# Patient Record
Sex: Male | Born: 1940 | Race: White | Hispanic: No | Marital: Married | Smoking: Former smoker
Health system: Southern US, Community
[De-identification: ages and names within clinical notes are randomized; demographics above are authoritative.]

## PROBLEM LIST (undated history)

## (undated) DIAGNOSIS — E663 Overweight: Secondary | ICD-10-CM

## (undated) DIAGNOSIS — G4733 Obstructive sleep apnea (adult) (pediatric): Secondary | ICD-10-CM

## (undated) DIAGNOSIS — F419 Anxiety disorder, unspecified: Secondary | ICD-10-CM

## (undated) DIAGNOSIS — J449 Chronic obstructive pulmonary disease, unspecified: Secondary | ICD-10-CM

## (undated) DIAGNOSIS — M1990 Unspecified osteoarthritis, unspecified site: Secondary | ICD-10-CM

## (undated) DIAGNOSIS — I2510 Atherosclerotic heart disease of native coronary artery without angina pectoris: Secondary | ICD-10-CM

## (undated) DIAGNOSIS — N400 Enlarged prostate without lower urinary tract symptoms: Secondary | ICD-10-CM

## (undated) DIAGNOSIS — I10 Essential (primary) hypertension: Secondary | ICD-10-CM

## (undated) DIAGNOSIS — E669 Obesity, unspecified: Secondary | ICD-10-CM

## (undated) DIAGNOSIS — E785 Hyperlipidemia, unspecified: Secondary | ICD-10-CM

## (undated) DIAGNOSIS — N289 Disorder of kidney and ureter, unspecified: Secondary | ICD-10-CM

## (undated) DIAGNOSIS — K219 Gastro-esophageal reflux disease without esophagitis: Secondary | ICD-10-CM

## (undated) DIAGNOSIS — E119 Type 2 diabetes mellitus without complications: Secondary | ICD-10-CM

## (undated) DIAGNOSIS — I4430 Unspecified atrioventricular block: Secondary | ICD-10-CM

## (undated) HISTORY — DX: Overweight: E66.3

## (undated) HISTORY — DX: Unspecified atrioventricular block: I44.30

## (undated) HISTORY — DX: Atherosclerotic heart disease of native coronary artery without angina pectoris: I25.10

## (undated) HISTORY — DX: Obesity, unspecified: E66.9

## (undated) HISTORY — DX: Disorder of kidney and ureter, unspecified: N28.9

## (undated) HISTORY — DX: Obstructive sleep apnea (adult) (pediatric): G47.33

## (undated) HISTORY — DX: Gastro-esophageal reflux disease without esophagitis: K21.9

## (undated) HISTORY — DX: Chronic obstructive pulmonary disease, unspecified: J44.9

## (undated) HISTORY — DX: Type 2 diabetes mellitus without complications: E11.9

## (undated) HISTORY — DX: Anxiety disorder, unspecified: F41.9

## (undated) HISTORY — DX: Benign prostatic hyperplasia without lower urinary tract symptoms: N40.0

## (undated) HISTORY — DX: Unspecified osteoarthritis, unspecified site: M19.90

## (undated) HISTORY — DX: Essential (primary) hypertension: I10

## (undated) HISTORY — DX: Hyperlipidemia, unspecified: E78.5

---
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
## 1993-04-27 DIAGNOSIS — I442 Atrioventricular block, complete: Secondary | ICD-10-CM | POA: Insufficient documentation

## 1993-04-27 DIAGNOSIS — E7800 Pure hypercholesterolemia, unspecified: Secondary | ICD-10-CM | POA: Insufficient documentation

## 2001-05-24 HISTORY — PX: PACEMAKER INSERTION: SHX728

## 2002-09-18 DIAGNOSIS — I10 Essential (primary) hypertension: Secondary | ICD-10-CM | POA: Insufficient documentation

## 2004-05-28 DIAGNOSIS — M1990 Unspecified osteoarthritis, unspecified site: Secondary | ICD-10-CM | POA: Insufficient documentation

## 2008-02-14 ENCOUNTER — Ambulatory Visit: Payer: Self-pay | Admitting: Cardiology

## 2008-08-14 DIAGNOSIS — I2510 Atherosclerotic heart disease of native coronary artery without angina pectoris: Secondary | ICD-10-CM

## 2008-08-14 DIAGNOSIS — Z950 Presence of cardiac pacemaker: Secondary | ICD-10-CM

## 2008-08-14 DIAGNOSIS — E785 Hyperlipidemia, unspecified: Secondary | ICD-10-CM | POA: Insufficient documentation

## 2008-08-15 ENCOUNTER — Encounter: Payer: Self-pay | Admitting: Cardiology

## 2008-08-15 ENCOUNTER — Ambulatory Visit: Payer: Self-pay | Admitting: Cardiology

## 2008-09-03 ENCOUNTER — Telehealth (INDEPENDENT_AMBULATORY_CARE_PROVIDER_SITE_OTHER): Payer: Self-pay

## 2008-09-04 ENCOUNTER — Encounter: Payer: Self-pay | Admitting: Cardiology

## 2008-09-04 ENCOUNTER — Ambulatory Visit: Payer: Self-pay

## 2010-06-05 ENCOUNTER — Encounter: Payer: Self-pay | Admitting: Internal Medicine

## 2010-06-05 DIAGNOSIS — I2510 Atherosclerotic heart disease of native coronary artery without angina pectoris: Secondary | ICD-10-CM | POA: Insufficient documentation

## 2010-06-05 DIAGNOSIS — K219 Gastro-esophageal reflux disease without esophagitis: Secondary | ICD-10-CM | POA: Insufficient documentation

## 2010-07-20 ENCOUNTER — Institutional Professional Consult (permissible substitution) (INDEPENDENT_AMBULATORY_CARE_PROVIDER_SITE_OTHER): Payer: Medicare Other | Admitting: Internal Medicine

## 2010-07-20 ENCOUNTER — Encounter: Payer: Self-pay | Admitting: Internal Medicine

## 2010-07-20 DIAGNOSIS — I442 Atrioventricular block, complete: Secondary | ICD-10-CM

## 2010-07-20 DIAGNOSIS — I2510 Atherosclerotic heart disease of native coronary artery without angina pectoris: Secondary | ICD-10-CM

## 2010-07-20 DIAGNOSIS — E663 Overweight: Secondary | ICD-10-CM

## 2010-07-20 DIAGNOSIS — R55 Syncope and collapse: Secondary | ICD-10-CM | POA: Insufficient documentation

## 2010-07-30 NOTE — Cardiovascular Report (Signed)
Summary: Office Visit   Office Visit   Imported By: Roderic Ovens 07/23/2010 16:11:19  _____________________________________________________________________  External Attachment:    Type:   Image     Comment:   External Document

## 2010-07-30 NOTE — Assessment & Plan Note (Signed)
Summary: nep pacemaker for 9 yrs need replacement...pt has medicare/mu...   Visit Type:  Initial Consult Primary Provider:  Adele Barthel MD  CC:  no complaints.  History of Present Illness:  [name] is seen to establish pacemaker followup. He is a 70 year old gentleman from [place] who has a diagnosis of complete heart block for which he underwent single-chamber pacing at [facility] in May 2003.  This represented a new installation following a pacemaker implanted 10 years previously for syncope and bradycardia. He has had no recurrent syncope.  He also has a history of coronary artery disease. He underwent attempted PCI of a right coronary lesion that was complicated by dissection. He was ultimately treated medically. He denies chest pain shortness of breath or exercise intolerance. He has not had a recent Myoview. His cholesterol is followed by Dr. prior [place]. These results are not available once the laboratory tests that we have.  Current Medications (verified): 1)  Lovastatin 20 Mg Tabs (Lovastatin) .... Take One Tablet By Mouth Daily At Bedtime 2)  Aspirin 81 Mg Tbec (Aspirin) .... Take One Tablet By Mouth Daily 3)  Niacin Cr 1000 Mg Cr-Tabs (Niacin) .... Two Times A Day 4)  Zantac 150 Mg Tabs (Ranitidine Hcl) .... Two Times A Day 5)  Prinivil 10 Mg Tabs (Lisinopril) .... 1/2 Once Daily 6)  Tenormin 100 Mg Tabs (Atenolol) .... Once Daily  Allergies (verified): No Known Drug Allergies  Past History:  Past Medical History: intermittent complete heart block Syncope Previously implanted Medtronic pacemaker 2003 Hypertension Coronary artery disease-[id number] Failed PCI complicated by dissection medical therapy Obesity Hyperlipidemia chronic renal insufficiency creatinine 1.6  Review of Systems       .full review of systems was negative apart from a history of present illness and past medical history.   Vital Signs:  Patient profile:   70 year old male Height:       64 inches Weight:      242 pounds BMI:     41.69 Pulse rate:   76 / minute BP sitting:   118 / 68  (left arm) Cuff size:   regular  Vitals Entered By: Hardin Negus, RMA (July 20, 2010 9:53 AM)  Physical Exam  General:  Alert and oriented  in no acute distress. HEENT  normal; status post eye enucleation. Neck veins were flat; carotids brisk and full without bruits. No lymphadenopathy. Back without kyphosis. Lungs clear. Heart sounds regular without murmurs or gallops. PMI nondisplaced. Abdomen soft with active bowel sounds without midline pulsation or hepatomegaly. Femoral pulses and distal pulses intact. Extremities were without clubbing cyanosis or edemaSkin warm and dry. Neurological exam grossly normal; affect normal; grossly normal motor and sensory function    Impression & Recommendations:  Problem # 1:  SYNCOPE (ICD-780.2)  no recurrent syncope His updated medication list for this problem includes:    Aspirin 81 Mg Tbec (Aspirin) .Marland Kitchen... Take one tablet by mouth daily    Prinivil 10 Mg Tabs (Lisinopril) .Marland Kitchen... 1/2 once daily    Tenormin 100 Mg Tabs (Atenolol) ..... Once daily  His updated medication list for this problem includes:    Aspirin 81 Mg Tbec (Aspirin) .Marland Kitchen... Take one tablet by mouth daily    Prinivil 10 Mg Tabs (Lisinopril) .Marland Kitchen... 1/2 once daily    Tenormin 100 Mg Tabs (Atenolol) ..... Once daily  Problem # 2:  AV BLOCK, COMPLETE -INTERMITTENT (ICD-426.0)  intermittent. He paces about 0.7% of the time at his lower rate limit. His  updated medication list for this problem includes:    Aspirin 81 Mg Tbec (Aspirin) .Marland Kitchen... Take one tablet by mouth daily    Prinivil 10 Mg Tabs (Lisinopril) .Marland Kitchen... 1/2 once daily    Tenormin 100 Mg Tabs (Atenolol) ..... Once daily  His updated medication list for this problem includes:    Aspirin 81 Mg Tbec (Aspirin) .Marland Kitchen... Take one tablet by mouth daily    Prinivil 10 Mg Tabs (Lisinopril) .Marland Kitchen... 1/2 once daily    Tenormin 100 Mg  Tabs (Atenolol) ..... Once daily  Problem # 3:  PACEMAKER, PERMANENT (ICD-V45.01) his device was programmed in the VVIR mode resulting in 7% ventricular pacing the bulk of which was at his higher heart rates. The device was reprogrammed to VVI  Problem # 4:  CAD (ICD-414.00) his statin's and cholesterol are followed by Dr. Reinaldo Raddle. It would be reasonable after 10 years to consider stress testing if he has any symptoms His updated medication list for this problem includes:    Aspirin 81 Mg Tbec (Aspirin) .Marland Kitchen... Take one tablet by mouth daily    Prinivil 10 Mg Tabs (Lisinopril) .Marland Kitchen... 1/2 once daily    Tenormin 100 Mg Tabs (Atenolol) ..... Once daily  Orders: EKG w/ Interpretation (93000)  Problem # 5:  OVERWEIGHT (ICD-278.02) encouraged weight loss  Patient Instructions: 1)  Your physician recommends that you continue on your current medications as directed. Please refer to the Current Medication list given to you today. 2)  Your physician wants you to follow-up in:  YEAR WITH DR Graciela Husbands MEDTRONIC DEVICE  You will receive a reminder letter in the mail two months in advance. If you don't receive a letter, please call our office to schedule the follow-up appointment.

## 2010-08-11 NOTE — Letter (Signed)
Summary: [facility]: Progress Note  [facility]: Progress Note   Imported By: Earl Many 07/30/2010 17:15:44  _____________________________________________________________________  External Attachment:    Type:   Image     Comment:   External Document

## 2010-10-06 NOTE — Assessment & Plan Note (Signed)
[facility]                            CARDIOLOGY OFFICE NOTE   LUCIANO, CINQUEMANI                        MRN:          [id number]  DATE:08/15/2008                            DOB:          1940-07-10    [name] is here for a cardiology followup.  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  He had a tight right  coronary that could not be opened as there was a dissection.  His other  vessels showed no major disease, otherwise.  He has a history of good LV  function.  I had reviewed his data recently when I saw him in September  2009 and he did not have any recent studies.  We do need to arrange for  him to have an adenosine Myoview scan and a 2-D echo at some point.   PAST MEDICAL HISTORY:   ALLERGIES:  No known drug allergies.   MEDICATIONS:  See the EMR.   OTHER MEDICAL PROBLEMS:  See the list below.   REVIEW OF SYSTEMS:  He has no fevers or chills.  There are no headaches.  He is overweight.  He is not having any GI or GU symptoms.  His review  of systems otherwise is negative.   PHYSICAL EXAMINATION:  GENERAL:  The patient is significantly  overweight.  He is oriented to person, time, and place.  Affect is  normal.  HEENT:  No xanthelasma.  He has normal extraocular motion.  NECK:  There are no carotid bruits.  There is no jugular venous  distention.  LUNGS:  Clear.  Respiratory effort is not labored.  CARDIAC:  An S1 with an S2.  There are no clicks or significant murmurs.  ABDOMEN:  Soft.  He has no peripheral edema.   EKG is normal.   The patient has a history of coronary artery disease.  We do need to  obtain an adenosine Myoview scan and this will be obtained.  I will then  see him back for followup in 6 months.     Luis Abed, MD, [facility]  Electronically Signed    JDK/MedQ  DD: 08/15/2008  DT: 08/16/2008  Job #: [id number]   cc:   Adele Barthel, MD- [place], [place], [place].

## 2010-10-06 NOTE — Assessment & Plan Note (Signed)
[facility]                            CARDIOLOGY OFFICE NOTE   NAJIR, ROOP                        MRN:          [id number]  DATE:02/14/2008                            DOB:          1940-12-01    ADDENDUM   I have reviewed all the outside information on Mr. Gronau.  There is no  data listed recently in terms of echoes or nuclear studies.  When I see  him back in 6 months, I will reconsider any uptake in his studies.     Luis Abed, MD, [facility]     JDK/MedQ  DD: 02/14/2008  DT: 02/15/2008  Job #: [id number]

## 2010-10-06 NOTE — Assessment & Plan Note (Signed)
[facility]                            CARDIOLOGY OFFICE NOTE   CRUISE, BAUMGARDNER                        MRN:          [id number]  DATE:02/14/2008                            DOB:          12/20/1940    [name] is referred for a cardiology consultation.  He takes care of  his wife.  Dr. Reinaldo Raddle has referred him for cardiac followup.  Dr. Reinaldo Raddle  follows him carefully and does a very nice job.  [name] does have a  significant cardiac history and would like to have a cardiologist  available to him as needed in our area.   In 1998, the patient had an MI.  I believe he had cardiopulmonary  arrest.  Cardiac cath was done showing that he had a high-grade right  coronary artery lesion.  It is my understanding that the attempt to open  this vessel was not successful and there was dissection.  However, he  stabilized and his other coronaries looks good.  He has done well since  then.  He had severe bradycardia.  He received a pacemaker for this and  had a new unit put in, in 2003.  This is a Medtronic Kappa 700 SR  pacemaker.  It was put in by Dr. Basilia Jumbo at [facility].   [name] is not having any significant symptoms.  He has no chest  pain.  He has no significant shortness of breath.  I have multiple  pieces of data from the outside all of which say that they are printed  in 2009 and it is difficult to find the dates when they were done.  At  this point now, I do not have any recent echo data or exercise data.  Historically, he has had a good ejection fraction.   ALLERGIES:  No known drug allergies.   MEDICATIONS:  Mevacor 20, nabumetone, Tenormin, Prinivil, Zantac,  niacin, lovastatin, and aspirin.   OTHER MEDICAL PROBLEMS:  See the list below.   REVIEW OF SYSTEMS:  He feels great today.   PHYSICAL EXAMINATION:  VITAL SIGNS:  Blood pressure is 122/70 with a  pulse of 64.  GENERAL:  The patient is oriented to person, time, and  place.  Affect is  normal.  HEENT:  Reveals no xanthelasma.  He has normal extraocular motions.  There are no carotid bruits.  There is no jugular venous distention.  LUNGS:  Clear.  Respiratory effort is not labored.  CARDIAC:  Reveals an S1 with an S2.  There are no clicks or significant  murmurs.  ABDOMEN:  Obese, but soft.  He has 2 scars in the area of his pacemaker.  He has no significant peripheral edema.   EKG shows normal sinus rhythm with no acute changes.   [name] is stable.  Problems include,  1. Coronary disease with an inferior myocardial infarction in 1998      with cardiopulmonary arrest.  He had a tight right coronary lesion      that could not be opened as there was dissection at that time.  His  other vessels showed no major disease.  2. History of good left ventricular function.  3. History of a permanent pacemaker for bradycardia.  He has it      checked at home.  We will register his data here.  4. History of left eye removal in the past.  5. Elevated cholesterol.   The patient is stable.  I will not be ordering any test at this time.  We will re-review the information to see if there is any echo data or  exercise data within the past 1-2 years.   ADDENDUM:  I have reviewed all the outside information on [name].  There is no data listed recently in terms of echoes or nuclear studies.  When I see him back in 6 months, I will reconsider any uptake in his  studies.     Luis Abed, MD, [facility]  Electronically Signed    JDK/MedQ  DD: 02/14/2008  DT: 02/15/2008  Job #: [id number]   cc:   Jonelle Sports. Reinaldo Raddle, MD

## 2010-10-22 ENCOUNTER — Ambulatory Visit: Payer: Medicare Other | Admitting: *Deleted

## 2010-10-24 ENCOUNTER — Encounter: Payer: Self-pay | Admitting: *Deleted

## 2010-10-26 ENCOUNTER — Other Ambulatory Visit: Payer: Self-pay

## 2010-10-26 ENCOUNTER — Ambulatory Visit (INDEPENDENT_AMBULATORY_CARE_PROVIDER_SITE_OTHER): Payer: Medicare Other | Admitting: *Deleted

## 2010-10-26 DIAGNOSIS — Z950 Presence of cardiac pacemaker: Secondary | ICD-10-CM

## 2010-10-26 DIAGNOSIS — I495 Sick sinus syndrome: Secondary | ICD-10-CM

## 2010-10-28 NOTE — Progress Notes (Signed)
ICD remote 

## 2010-11-04 NOTE — Progress Notes (Signed)
Pacer remote check  

## 2010-11-06 ENCOUNTER — Encounter: Payer: Self-pay | Admitting: *Deleted

## 2011-02-04 ENCOUNTER — Encounter: Payer: Medicare Other | Admitting: *Deleted

## 2011-02-08 ENCOUNTER — Encounter: Payer: Medicare Other | Admitting: *Deleted

## 2011-02-18 ENCOUNTER — Encounter: Payer: Self-pay | Admitting: Internal Medicine

## 2011-02-18 ENCOUNTER — Other Ambulatory Visit: Payer: Self-pay | Admitting: Internal Medicine

## 2011-02-18 ENCOUNTER — Ambulatory Visit (INDEPENDENT_AMBULATORY_CARE_PROVIDER_SITE_OTHER): Payer: Medicare Other | Admitting: *Deleted

## 2011-02-18 DIAGNOSIS — Z950 Presence of cardiac pacemaker: Secondary | ICD-10-CM

## 2011-02-18 DIAGNOSIS — I498 Other specified cardiac arrhythmias: Secondary | ICD-10-CM

## 2011-02-19 LAB — REMOTE PACEMAKER DEVICE
BATTERY VOLTAGE: 2.57 V
BMOD-0005RV: 95 {beats}/min
RV LEAD THRESHOLD: 0.75 V

## 2011-02-25 ENCOUNTER — Encounter: Payer: Self-pay | Admitting: *Deleted

## 2011-02-25 NOTE — Progress Notes (Signed)
Pacer remote check  

## 2011-05-20 ENCOUNTER — Ambulatory Visit (INDEPENDENT_AMBULATORY_CARE_PROVIDER_SITE_OTHER): Payer: Medicare Other | Admitting: *Deleted

## 2011-05-20 ENCOUNTER — Encounter: Payer: Self-pay | Admitting: Internal Medicine

## 2011-05-20 ENCOUNTER — Other Ambulatory Visit: Payer: Self-pay | Admitting: Internal Medicine

## 2011-05-20 DIAGNOSIS — Z950 Presence of cardiac pacemaker: Secondary | ICD-10-CM

## 2011-05-20 DIAGNOSIS — R55 Syncope and collapse: Secondary | ICD-10-CM

## 2011-05-20 DIAGNOSIS — I495 Sick sinus syndrome: Secondary | ICD-10-CM

## 2011-05-21 LAB — REMOTE PACEMAKER DEVICE: RV LEAD IMPEDENCE PM: 520 Ohm

## 2011-05-27 NOTE — Progress Notes (Signed)
Remote pacer check  

## 2011-06-07 ENCOUNTER — Encounter: Payer: Self-pay | Admitting: *Deleted

## 2011-06-09 ENCOUNTER — Ambulatory Visit (INDEPENDENT_AMBULATORY_CARE_PROVIDER_SITE_OTHER): Payer: Medicare Other | Admitting: Internal Medicine

## 2011-06-09 ENCOUNTER — Encounter: Payer: Self-pay | Admitting: Internal Medicine

## 2011-06-09 ENCOUNTER — Encounter: Payer: Self-pay | Admitting: *Deleted

## 2011-06-09 DIAGNOSIS — Z950 Presence of cardiac pacemaker: Secondary | ICD-10-CM

## 2011-06-09 DIAGNOSIS — I2510 Atherosclerotic heart disease of native coronary artery without angina pectoris: Secondary | ICD-10-CM

## 2011-06-09 DIAGNOSIS — I442 Atrioventricular block, complete: Secondary | ICD-10-CM | POA: Insufficient documentation

## 2011-06-09 LAB — CBC WITH DIFFERENTIAL/PLATELET
Basophils Relative: 0.5 % (ref 0.0–3.0)
Eosinophils Relative: 4 % (ref 0.0–5.0)
HCT: 47.1 % (ref 39.0–52.0)
Lymphs Abs: 2.2 10*3/uL (ref 0.7–4.0)
MCV: 92.5 fl (ref 78.0–100.0)
Monocytes Absolute: 1.1 10*3/uL — ABNORMAL HIGH (ref 0.1–1.0)
Monocytes Relative: 13 % — ABNORMAL HIGH (ref 3.0–12.0)
Neutrophils Relative %: 56.5 % (ref 43.0–77.0)
Platelets: 137 10*3/uL — ABNORMAL LOW (ref 150.0–400.0)
RBC: 5.09 Mil/uL (ref 4.22–5.81)
WBC: 8.6 10*3/uL (ref 4.5–10.5)

## 2011-06-09 LAB — BASIC METABOLIC PANEL
BUN: 25 mg/dL — ABNORMAL HIGH (ref 6–23)
Chloride: 105 mEq/L (ref 96–112)
GFR: 47.39 mL/min — ABNORMAL LOW (ref >60.00)
Potassium: 5.4 mEq/L — ABNORMAL HIGH (ref 3.5–5.1)
Sodium: 141 mEq/L (ref 135–145)

## 2011-06-09 LAB — PROTIME-INR
INR: 1 ratio (ref 0.8–1.0)
Prothrombin Time: 11 s (ref 10.2–12.4)

## 2011-06-09 NOTE — Patient Instructions (Signed)
Your physician recommends that you have lab work today: bmp/cbc/inr  Your physician has recommended that you have a pacemaker generator change. Please see the instruction sheet given to you today for more information.  Your physician recommends that you continue on your current medications as directed. Please refer to the Current Medication list given to you today.

## 2011-06-09 NOTE — Progress Notes (Signed)
  HPI  Kenneth [name] is a 70 y.o. male seen t in followup for a pacemaker implanted initially approximately 20 years ago for intermittent complete heart block. He underwent generator replacement 10 years ago. He comes in now having reached ERI  When he was seen in February he was paced on ecg but intrerrogation showed only 0.7% pacing  His original device was abandoned in its entirety in 2003 with placement of a new ventricular lead at the same time KAPPA 700 pulse generator was implanted.    He also has a history of coronary artery disease. He underwent attempted PCI of a right coronary lesion that was complicated by dissection. He was ultimately treated medically. He denies chest pain shortness of breath or exercise intolerance. He has not had a recent Myoview.    Past Medical History  Diagnosis Date  . Syncope   . AV block   . Overweight   . Hyperlipemia   . Pacemaker   . CAD (coronary artery disease)     Past Surgical History  Procedure Date  . Left eye removal 1957  . Pacemaker insertion 2003    Medtronic    Current Outpatient Prescriptions  Medication Sig Dispense Refill  . aspirin 81 MG tablet Take 81 mg by mouth daily.       . atenolol (TENORMIN) 100 MG tablet Take 100 mg by mouth daily.      . lisinopril (PRINIVIL,ZESTRIL) 10 MG tablet Take 5 mg by mouth daily.       . lovastatin (MEVACOR) 20 MG tablet Take 10 mg by mouth at bedtime.       . niacin (NIASPAN) 1000 MG CR tablet Take 1,000 mg by mouth 2 (two) times daily.       . omeprazole (PRILOSEC) 20 MG capsule Take 20 mg by mouth daily.        No Known Allergies  Review of Systems negative except from HPI and PMH  Physical Exam BP 110/75  Pulse 65  Ht 5' 4" (1.626 m)  Wt 241 lb 12.8 oz (109.68 kg)  BMI 41.50 kg/m2 Well developed and well nourished in no acute distress HENT normal E scleral and icterus clear Neck Supple JVP flat; carotids brisk and full Clear to ausculation Regular rate and rhythm,  no murmurs gallops or rub Soft with active bowel sounds No clubbing cyanosis none Edema Alert and oriented, grossly normal motor and sensory function Skin Warm and Dry  Ventricular pacing at 65  Assessment and  Plan  

## 2011-06-09 NOTE — Assessment & Plan Note (Signed)
Stable on current meds 

## 2011-06-09 NOTE — Assessment & Plan Note (Signed)
Reached ERI. We'll undertake generator replacement

## 2011-06-09 NOTE — Assessment & Plan Note (Addendum)
Degree of ventricular pacing is not known. He is paced now following reversion to VVI 65. He's had no untoward symptoms with this. He has had a single chamber pacemaker for 20 years. We will plan to continue by using the ventricular lead implanted in 2003 in conjunction with the new device. We reviewed potential benefits and risks including but not limited to infection to understand agree and are willing to proceed.  We will plan to do   chest x-ray to see the status of the of the lead.

## 2011-06-10 ENCOUNTER — Encounter (HOSPITAL_COMMUNITY): Payer: Self-pay | Admitting: Pharmacy Technician

## 2011-06-18 ENCOUNTER — Other Ambulatory Visit: Payer: Self-pay | Admitting: Internal Medicine

## 2011-06-18 DIAGNOSIS — Z950 Presence of cardiac pacemaker: Secondary | ICD-10-CM

## 2011-06-22 MED ORDER — SODIUM CHLORIDE 0.9 % IV SOLN
INTRAVENOUS | Status: DC
  Administered 2011-06-23: 11:00:00 via INTRAVENOUS

## 2011-06-22 MED ORDER — CEFAZOLIN SODIUM-DEXTROSE 2-3 GM-% IV SOLR
2.0000 g | INTRAVENOUS | Status: DC
  Filled 2011-06-22: qty 50

## 2011-06-22 MED ORDER — SODIUM CHLORIDE 0.9 % IR SOLN
80.0000 mg | Status: DC
  Filled 2011-06-22: qty 2

## 2011-06-23 ENCOUNTER — Ambulatory Visit (HOSPITAL_COMMUNITY)
Admission: RE | Admit: 2011-06-23 | Discharge: 2011-06-23 | Disposition: A | Discharge status: Home / Self Care | Payer: Medicare Other | Source: Ambulatory Visit | Attending: Internal Medicine | Admitting: Internal Medicine

## 2011-06-23 ENCOUNTER — Encounter (HOSPITAL_COMMUNITY)
Admission: RE | Disposition: A | Discharge status: Home / Self Care | Payer: Self-pay | Source: Ambulatory Visit | Attending: Internal Medicine

## 2011-06-23 ENCOUNTER — Ambulatory Visit (HOSPITAL_COMMUNITY): Payer: Medicare Other

## 2011-06-23 DIAGNOSIS — Z45018 Encounter for adjustment and management of other part of cardiac pacemaker: Secondary | ICD-10-CM | POA: Insufficient documentation

## 2011-06-23 DIAGNOSIS — Z950 Presence of cardiac pacemaker: Secondary | ICD-10-CM

## 2011-06-23 DIAGNOSIS — Z79899 Other long term (current) drug therapy: Secondary | ICD-10-CM | POA: Insufficient documentation

## 2011-06-23 DIAGNOSIS — Z7982 Long term (current) use of aspirin: Secondary | ICD-10-CM | POA: Insufficient documentation

## 2011-06-23 DIAGNOSIS — I2510 Atherosclerotic heart disease of native coronary artery without angina pectoris: Secondary | ICD-10-CM | POA: Insufficient documentation

## 2011-06-23 DIAGNOSIS — R55 Syncope and collapse: Secondary | ICD-10-CM | POA: Insufficient documentation

## 2011-06-23 DIAGNOSIS — I442 Atrioventricular block, complete: Secondary | ICD-10-CM

## 2011-06-23 DIAGNOSIS — E785 Hyperlipidemia, unspecified: Secondary | ICD-10-CM | POA: Insufficient documentation

## 2011-06-23 HISTORY — PX: PERMANENT PACEMAKER GENERATOR CHANGE: SHX6022

## 2011-06-23 LAB — BASIC METABOLIC PANEL
CO2: 23 mEq/L (ref 19–32)
Calcium: 9.5 mg/dL (ref 8.4–10.5)
Chloride: 105 mEq/L (ref 96–112)
Potassium: 4.6 mEq/L (ref 3.5–5.1)
Sodium: 140 mEq/L (ref 135–145)

## 2011-06-23 SURGERY — PERMANENT PACEMAKER GENERATOR CHANGE
Anesthesia: LOCAL

## 2011-06-23 MED ORDER — LIDOCAINE HCL (PF) 1 % IJ SOLN
INTRAMUSCULAR | Status: AC
  Filled 2011-06-23: qty 60

## 2011-06-23 MED ORDER — FENTANYL CITRATE 0.05 MG/ML IJ SOLN
INTRAMUSCULAR | Status: AC
  Filled 2011-06-23: qty 2

## 2011-06-23 MED ORDER — CEFAZOLIN SODIUM-DEXTROSE 2-3 GM-% IV SOLR
INTRAVENOUS | Status: AC
  Filled 2011-06-23: qty 50

## 2011-06-23 MED ORDER — MUPIROCIN 2 % EX OINT
TOPICAL_OINTMENT | CUTANEOUS | Status: AC
  Administered 2011-06-23: 1 via NASAL
  Filled 2011-06-23: qty 22

## 2011-06-23 MED ORDER — CHLORHEXIDINE GLUCONATE 4 % EX LIQD
60.0000 mL | Freq: Once | CUTANEOUS | Status: DC
  Filled 2011-06-23: qty 60

## 2011-06-23 MED ORDER — MIDAZOLAM HCL 5 MG/5ML IJ SOLN
INTRAMUSCULAR | Status: AC
  Filled 2011-06-23: qty 5

## 2011-06-23 MED ORDER — ONDANSETRON HCL 4 MG/2ML IJ SOLN: 4.0000 mg | Freq: Four times a day (QID) | INTRAMUSCULAR | Status: DC | PRN

## 2011-06-23 MED ORDER — MUPIROCIN 2 % EX OINT: TOPICAL_OINTMENT | Freq: Once | CUTANEOUS | Status: DC

## 2011-06-23 MED ORDER — SODIUM CHLORIDE 0.9 % IV SOLN: INTRAVENOUS | Status: AC

## 2011-06-23 MED ORDER — ACETAMINOPHEN 325 MG PO TABS: 325.0000 mg | ORAL_TABLET | ORAL | Status: DC | PRN

## 2011-06-23 MED ORDER — CEFAZOLIN SODIUM 1-5 GM-% IV SOLN: 1.0000 g | Freq: Once | INTRAVENOUS | Status: DC

## 2011-06-23 MED ORDER — SODIUM CHLORIDE 0.9 % IV SOLN: Freq: Once | INTRAVENOUS | Status: DC

## 2011-06-23 NOTE — Interval H&P Note (Signed)
History and Physical Interval Note:  06/23/2011 1:42 PM  Kenneth [name]  has presented today for surgery, with the diagnosis of End of Life  The various methods of treatment have been discussed with the patient and family. After consideration of risks, benefits and other options for treatment, the patient has consented to  Procedure(s): PERMANENT PACEMAKER GENERATOR CHANGE as a surgical intervention .  The patients' history has been reviewed, patient examined, no change in status, stable for surgery.  I have reviewed the patients' chart and labs.  Questions were answered to the patient's satisfaction.     Sherryl Manges

## 2011-06-23 NOTE — Brief Op Note (Signed)
06/23/2011  3:02 PM  PATIENT:  Kenneth [name]  71 y.o. male  PRE-OPERATIVE DIAGNOSIS:  End of Life  POST-OPERATIVE DIAGNOSIS:  Complete heart block intermittent  PROCEDURE:  Procedure(s): PERMANENT PACEMAKER GENERATOR CHANGE Pocket revision  SURGEON:  Surgeon(s): Duke Salvia, MD  PHYSICIAN ASSISTANT:   ASSISTANTS: none   ANESTHESIA:   local and IV sedation   DICTATION: .Other Dictation: Dictation Number [id number]  PLAN OF CARE: Discharge to home after PACU

## 2011-06-23 NOTE — H&P (View-Only) (Signed)
  HPI  Kenneth [name] is a 71 y.o. male seen t in followup for a pacemaker implanted initially approximately 20 years ago for intermittent complete heart block. He underwent generator replacement 10 years ago. He comes in now having reached ERI  When he was seen in February he was paced on ecg but intrerrogation showed only 0.7% pacing  His original device was abandoned in its entirety in 2003 with placement of a new ventricular lead at the same time KAPPA 700 pulse generator was implanted.    He also has a history of coronary artery disease. He underwent attempted PCI of a right coronary lesion that was complicated by dissection. He was ultimately treated medically. He denies chest pain shortness of breath or exercise intolerance. He has not had a recent Myoview.    Past Medical History  Diagnosis Date  . Syncope   . AV block   . Overweight   . Hyperlipemia   . Pacemaker   . CAD (coronary artery disease)     Past Surgical History  Procedure Date  . Left eye removal 1957  . Pacemaker insertion 2003    Medtronic    Current Outpatient Prescriptions  Medication Sig Dispense Refill  . aspirin 81 MG tablet Take 81 mg by mouth daily.       Marland Kitchen atenolol (TENORMIN) 100 MG tablet Take 100 mg by mouth daily.      Marland Kitchen lisinopril (PRINIVIL,ZESTRIL) 10 MG tablet Take 5 mg by mouth daily.       Marland Kitchen lovastatin (MEVACOR) 20 MG tablet Take 10 mg by mouth at bedtime.       . niacin (NIASPAN) 1000 MG CR tablet Take 1,000 mg by mouth 2 (two) times daily.       Marland Kitchen omeprazole (PRILOSEC) 20 MG capsule Take 20 mg by mouth daily.        No Known Allergies  Review of Systems negative except from HPI and PMH  Physical Exam BP 110/75  Pulse 65  Ht 5\' 4"  (1.626 m)  Wt 241 lb 12.8 oz (109.68 kg)  BMI 41.50 kg/m2 Well developed and well nourished in no acute distress HENT normal E scleral and icterus clear Neck Supple JVP flat; carotids brisk and full Clear to ausculation Regular rate and rhythm,  no murmurs gallops or rub Soft with active bowel sounds No clubbing cyanosis none Edema Alert and oriented, grossly normal motor and sensory function Skin Warm and Dry  Ventricular pacing at 65  Assessment and  Plan

## 2011-06-24 NOTE — Op Note (Signed)
NAME:  Kenneth [name], Kenneth [name] NO.:  [id number]  MEDICAL RECORD NO.:  [id number]  LOCATION:  [facility]                         FACILITY:  [facility]  PHYSICIAN:  Duke Salvia, MD, FACCDATE OF BIRTH:  Jan 16, 1941  DATE OF PROCEDURE:  06/23/2011 DATE OF DISCHARGE:  06/23/2011                              OPERATIVE REPORT   PREOPERATIVE DIAGNOSIS:  Previously implanted pacemaker, now at elective replacement indicator.  POSTOPERATIVE DIAGNOSIS:  Previously implanted pacemaker, now at elective replacement indicator.  PROCEDURE:  Single-chamber explantation, pocket revision, and single- chamber implantation.  Following obtaining informed consent, the patient was brought to Electrophysiology Laboratory and placed on the fluoroscopic table in supine position.  After routine prep and drape of the left upper chest, lidocaine was infiltrated just below the line of the previous incision as the device had rotated clockwise about 90-120 degrees.  Following this, an incision was made and carried down to the layer of device pocket using sharp dissection and electrocautery.  The pocket was opened.  The device was explanted.  The lead was inspected and appeared to be in good function.  The previously implanted and sacrificed unipolar lead was in the posterior aspect of the pocket.  Interrogation of the lead demonstrated an amplitude of 9.5 with a pace impedance of 524, threshold 0.7 at 0.5.  With these acceptable parameters recorded, the lead was attached to an Adapta SR1 pulse generator, serial number [id number].  The pocket was copiously irrigated with antibiotic- containing saline solution.  Hemostasis was assured.  Leads and pulse generator were placed in the pocket.  The wound was closed in 3 layers in a normal fashion following antibiotic irrigation.  The patient tolerated the procedure without apparent complication.  Needle counts, sponge counts, and instrument counts were correct at  the end of procedure according to the staff.  Dictation ended at this point.     Duke Salvia, MD, [facility]     SCK/MEDQ  D:  06/23/2011  T:  06/24/2011  Job:  [id number]

## 2011-06-29 ENCOUNTER — Encounter: Payer: Medicare Other | Admitting: Physician Assistant

## 2011-06-30 ENCOUNTER — Encounter: Payer: Self-pay | Admitting: Internal Medicine

## 2011-06-30 ENCOUNTER — Ambulatory Visit (INDEPENDENT_AMBULATORY_CARE_PROVIDER_SITE_OTHER): Payer: Medicare Other | Admitting: *Deleted

## 2011-06-30 DIAGNOSIS — I442 Atrioventricular block, complete: Secondary | ICD-10-CM

## 2011-06-30 LAB — PACEMAKER DEVICE OBSERVATION
BMOD-0003RV: 30
BRDY-0002RV: 50 {beats}/min
RV LEAD AMPLITUDE: 8 mv
RV LEAD THRESHOLD: 0.75 V
VENTRICULAR PACING PM: 0.1

## 2011-06-30 NOTE — Progress Notes (Signed)
Wound check-PPM 

## 2011-07-27 ENCOUNTER — Encounter: Payer: Medicare Other | Admitting: Internal Medicine

## 2011-09-30 ENCOUNTER — Encounter: Payer: Medicare Other | Admitting: *Deleted

## 2011-10-08 ENCOUNTER — Encounter: Payer: Self-pay | Admitting: *Deleted

## 2011-10-19 ENCOUNTER — Telehealth: Payer: Self-pay | Admitting: Internal Medicine

## 2011-10-19 NOTE — Telephone Encounter (Signed)
Have not received transmission.  Wife will try to send again today.  Will look later this afternoon.

## 2011-10-19 NOTE — Telephone Encounter (Signed)
New msg Pt's wife called and wanted to know if received transmission. She said they were out of town when original transmission was scheduled and sent it in the next week.

## 2011-10-20 ENCOUNTER — Ambulatory Visit (INDEPENDENT_AMBULATORY_CARE_PROVIDER_SITE_OTHER): Payer: Medicare Other | Admitting: *Deleted

## 2011-10-20 ENCOUNTER — Encounter: Payer: Self-pay | Admitting: Internal Medicine

## 2011-10-20 DIAGNOSIS — I442 Atrioventricular block, complete: Secondary | ICD-10-CM

## 2011-10-20 LAB — REMOTE PACEMAKER DEVICE
BATTERY VOLTAGE: 2.79 V
BMOD-0003RV: 30
BMOD-0005RV: 95 {beats}/min
RV LEAD IMPEDENCE PM: 564 Ohm
VENTRICULAR PACING PM: 0.4

## 2011-10-20 NOTE — Telephone Encounter (Signed)
Transmission received. Left message with information.

## 2011-10-21 NOTE — Progress Notes (Signed)
PPM remote 

## 2011-12-03 ENCOUNTER — Encounter: Payer: Self-pay | Admitting: *Deleted

## 2012-01-27 ENCOUNTER — Encounter: Payer: Medicare Other | Admitting: *Deleted

## 2012-02-02 ENCOUNTER — Ambulatory Visit (INDEPENDENT_AMBULATORY_CARE_PROVIDER_SITE_OTHER): Payer: Medicare Other | Admitting: *Deleted

## 2012-02-02 ENCOUNTER — Encounter: Payer: Self-pay | Admitting: *Deleted

## 2012-02-02 ENCOUNTER — Encounter: Payer: Self-pay | Admitting: Internal Medicine

## 2012-02-02 DIAGNOSIS — Z950 Presence of cardiac pacemaker: Secondary | ICD-10-CM

## 2012-02-02 DIAGNOSIS — I442 Atrioventricular block, complete: Secondary | ICD-10-CM

## 2012-02-18 LAB — REMOTE PACEMAKER DEVICE
BMOD-0003RV: 30
BRDY-0002RV: 50 {beats}/min
RV LEAD AMPLITUDE: 16 mv
RV LEAD IMPEDENCE PM: 586 Ohm
RV LEAD THRESHOLD: 0.875 V

## 2012-03-01 ENCOUNTER — Encounter: Payer: Self-pay | Admitting: *Deleted

## 2012-05-30 ENCOUNTER — Encounter: Payer: Medicare Other | Admitting: Internal Medicine

## 2012-07-12 DIAGNOSIS — Z0000 Encounter for general adult medical examination without abnormal findings: Secondary | ICD-10-CM | POA: Insufficient documentation

## 2012-08-16 ENCOUNTER — Encounter: Payer: Medicare Other | Admitting: Internal Medicine

## 2012-08-25 ENCOUNTER — Ambulatory Visit (INDEPENDENT_AMBULATORY_CARE_PROVIDER_SITE_OTHER): Payer: Medicare Other | Admitting: Cardiology

## 2012-08-25 ENCOUNTER — Encounter: Payer: Self-pay | Admitting: Cardiology

## 2012-08-25 ENCOUNTER — Encounter: Payer: Self-pay | Admitting: Internal Medicine

## 2012-08-25 VITALS — BP 145/80 | HR 79 | Ht 64.0 in | Wt 251.0 lb

## 2012-08-25 DIAGNOSIS — I2510 Atherosclerotic heart disease of native coronary artery without angina pectoris: Secondary | ICD-10-CM

## 2012-08-25 DIAGNOSIS — Z950 Presence of cardiac pacemaker: Secondary | ICD-10-CM

## 2012-08-25 DIAGNOSIS — I442 Atrioventricular block, complete: Secondary | ICD-10-CM

## 2012-08-25 LAB — PACEMAKER DEVICE OBSERVATION
BATTERY VOLTAGE: 2.79 V
BMOD-0003RV: 30
BMOD-0005RV: 95 {beats}/min
VENTRICULAR PACING PM: 0.5

## 2012-08-25 NOTE — Patient Instructions (Signed)
Remote monitoring is used to monitor your Pacemaker of ICD from home. This monitoring reduces the number of office visits required to check your device to one time per year. It allows [place] to keep an eye on the functioning of your device to ensure it is working properly. You are scheduled for a device check from home on 11/27/12. You may send your transmission at any time that day. If you have a wireless device, the transmission will be sent automatically. After your physician reviews your transmission, you will receive a postcard with your next transmission date.

## 2012-08-25 NOTE — Progress Notes (Signed)
ELECTROPHYSIOLOGY OFFICE NOTE  Patient ID: Kenneth [name] MRN: [id number], DOB/AGE: 1940/10/16   Date of Visit: 08/25/2012  Primary Physician: Kenneth Pitch, MD Primary Cardiologist: Kenneth Husbands, MD Reason for Visit: EP/device follow-up  History of Present Illness  Kenneth [name] is a pleasant 72 year old with transient CHB s/p PPM and CAD who presents today for routine electrophysiology followup. Since last being seen in our clinic, he reports he is doing well. He has no complaints and is excited, preparing to travel cross country this summer with his grandson. Today, he specifically denies chest pain or shortness of breath. He denies palpitations, dizziness, near syncope or syncope. He denies LE swelling, orthopnea, PND or recent weight gain. [name] reports he is compliant and tolerating medications without difficulty.  Past Medical History Past Medical History  Diagnosis Date  . Syncope   . AV block   . Overweight   . Hyperlipemia   . Pacemaker   . CAD (coronary artery disease)     Past Surgical History Past Surgical History  Procedure Laterality Date  . Left eye removal  1957  . Pacemaker insertion  2003    Medtronic     Allergies/Intolerances No Known Allergies  Current Home Medications Current Outpatient Prescriptions  Medication Sig Dispense Refill  . aspirin 81 MG tablet Take 81 mg by mouth daily.       Marland Kitchen atenolol (TENORMIN) 100 MG tablet Take 100 mg by mouth daily.      Marland Kitchen ibuprofen (ADVIL,MOTRIN) 200 MG tablet Take 400 mg by mouth every 6 (six) hours as needed. For pain      . losartan (COZAAR) 50 MG tablet Take 1 tablet by mouth daily.      Marland Kitchen lovastatin (MEVACOR) 20 MG tablet Take 10 mg by mouth at bedtime.       . niacin (NIASPAN) 1000 MG CR tablet Take 1,000 mg by mouth 2 (two) times daily.       Marland Kitchen omeprazole (PRILOSEC) 20 MG capsule Take 20 mg by mouth daily.       No current facility-administered medications for this visit.    Social History Social History   . Marital Status: Married   Occupational History  . retired    Social History Main Topics  . Smoking status: Former Smoker    Types: Cigarettes    Quit date: 05/25/1991  . Smokeless tobacco: No  . Alcohol Use: No  . Drug Use: No   Review of Systems General: No chills, fever, night sweats or weight changes Cardiovascular: No chest pain, dyspnea on exertion, edema, orthopnea, palpitations, paroxysmal nocturnal dyspnea Dermatological: No rash, lesions or masses Respiratory: No cough, dyspnea Urologic: No hematuria, dysuria Abdominal: No nausea, vomiting, diarrhea, bright red blood per rectum, melena, or hematemesis Neurologic: No visual changes, weakness, changes in mental status All other systems reviewed and are otherwise negative except as noted above.  Physical Exam Blood pressure 145/80, pulse 79, height 5\' 4"  (1.626 m), weight 251 lb (113.853 kg), SpO2 96.00%.  General: Well developed, well appearing 72 year old male in no acute distress. HEENT: Normocephalic, atraumatic. EOMs intact. Sclera nonicteric. Oropharynx clear.  Neck: Supple. No JVD. Lungs: Respirations regular and unlabored, CTA bilaterally. No wheezes, rales or rhonchi. Heart: RRR. S1, S2 present. No murmurs, rub, S3 or S4. Abdomen: Soft, non-distended.  Extremities: No clubbing, cyanosis or edema. DP/PT/Radials 2+ and equal bilaterally. Psych: Normal affect. Neuro: Alert and oriented X 3. Moves all extremities spontaneously.   Diagnostics Device interrogation today -  Normal VVI PPM function. Threshold, sensing, impedance consistent with previous measurements. Device programmed to maximize longevity. 1 VHR episode, 3 seconds, EGM reviewed and NOT consistent with NSVT. No evidence of ventricular arrhythmia. Device programmed at appropriate safety margins. Histogram distribution appropriate for patient activity level. Device programmed to optimize intrinsic conduction. Estimated longevity 10 years.   Assessment  and Plan 1. Complete heart block, transient, s/p PPM implant Normal PPM function No programming changes made Continue routine remote device follow-up every 3 months Return to clinic for follow-up with Dr. Graciela [name] in one year 2. CAD Stable without anginal symptoms Continue medical therapy  Signed, Kenneth Duff, PA-C 08/25/2012, 12:23 PM

## 2012-11-27 ENCOUNTER — Encounter: Payer: Medicare Other | Admitting: *Deleted

## 2012-12-08 ENCOUNTER — Encounter: Payer: Self-pay | Admitting: *Deleted

## 2012-12-14 ENCOUNTER — Ambulatory Visit (INDEPENDENT_AMBULATORY_CARE_PROVIDER_SITE_OTHER): Payer: Medicare Other | Admitting: *Deleted

## 2012-12-14 DIAGNOSIS — I442 Atrioventricular block, complete: Secondary | ICD-10-CM

## 2012-12-14 DIAGNOSIS — Z950 Presence of cardiac pacemaker: Secondary | ICD-10-CM

## 2012-12-28 LAB — REMOTE PACEMAKER DEVICE
BMOD-0003RV: 30
BRDY-0002RV: 50 {beats}/min
RV LEAD AMPLITUDE: 16 mv
RV LEAD IMPEDENCE PM: 667 Ohm
RV LEAD THRESHOLD: 0.75 V

## 2013-01-08 ENCOUNTER — Encounter: Payer: Self-pay | Admitting: Internal Medicine

## 2013-03-26 ENCOUNTER — Ambulatory Visit (INDEPENDENT_AMBULATORY_CARE_PROVIDER_SITE_OTHER): Payer: Medicare Other | Admitting: *Deleted

## 2013-03-26 DIAGNOSIS — Z950 Presence of cardiac pacemaker: Secondary | ICD-10-CM

## 2013-03-26 DIAGNOSIS — I442 Atrioventricular block, complete: Secondary | ICD-10-CM

## 2013-03-27 LAB — REMOTE PACEMAKER DEVICE
BRDY-0002RV: 50 {beats}/min
VENTRICULAR PACING PM: 1

## 2013-04-06 ENCOUNTER — Telehealth: Payer: Self-pay | Admitting: *Deleted

## 2013-04-06 NOTE — Telephone Encounter (Signed)
Ms. Tyrease requesting printout of most recent home transmission---printed & mailed today.

## 2013-05-02 ENCOUNTER — Encounter: Payer: Self-pay | Admitting: *Deleted

## 2013-05-07 ENCOUNTER — Encounter: Payer: Self-pay | Admitting: Internal Medicine

## 2013-06-27 ENCOUNTER — Ambulatory Visit (INDEPENDENT_AMBULATORY_CARE_PROVIDER_SITE_OTHER): Payer: Medicare Other | Admitting: *Deleted

## 2013-06-27 DIAGNOSIS — I442 Atrioventricular block, complete: Secondary | ICD-10-CM

## 2013-07-03 LAB — MDC_IDC_ENUM_SESS_TYPE_REMOTE
Lead Channel Impedance Value: 620 Ohm
Lead Channel Setting Pacing Amplitude: 2.5 V
Lead Channel Setting Sensing Sensitivity: 2.8 mV
MDC IDC MSMT LEADCHNL RV PACING THRESHOLD AMPLITUDE: 0.5 V
MDC IDC MSMT LEADCHNL RV PACING THRESHOLD PULSEWIDTH: 0.4 ms
MDC IDC MSMT LEADCHNL RV SENSING INTR AMPL: 16 mV
MDC IDC SET LEADCHNL RV PACING PULSEWIDTH: 0.4 ms
MDC IDC STAT BRADY RV PERCENT PACED: 0.6 %

## 2013-07-09 DIAGNOSIS — N400 Enlarged prostate without lower urinary tract symptoms: Secondary | ICD-10-CM | POA: Insufficient documentation

## 2013-07-18 ENCOUNTER — Encounter: Payer: Self-pay | Admitting: *Deleted

## 2013-08-07 ENCOUNTER — Encounter: Payer: Self-pay | Admitting: Internal Medicine

## 2013-08-28 ENCOUNTER — Encounter: Payer: Self-pay | Admitting: Internal Medicine

## 2013-08-28 ENCOUNTER — Ambulatory Visit (INDEPENDENT_AMBULATORY_CARE_PROVIDER_SITE_OTHER): Payer: Medicare Other | Admitting: Internal Medicine

## 2013-08-28 VITALS — BP 130/80 | HR 71 | Ht 64.0 in | Wt 266.0 lb

## 2013-08-28 DIAGNOSIS — I442 Atrioventricular block, complete: Secondary | ICD-10-CM

## 2013-08-28 DIAGNOSIS — Z950 Presence of cardiac pacemaker: Secondary | ICD-10-CM

## 2013-08-28 NOTE — Patient Instructions (Signed)
Remote monitoring is used to monitor your pacemaker from home. This monitoring reduces the number of office visits required to check your device to one time per year. It allows us to keep an eye on the functioning of your device to ensure it is working properly. You are scheduled for a device check from home on 11-29-2013. You may send your transmission at any time that day. If you have a wireless device, the transmission will be sent automatically. After your physician reviews your transmission, you will receive a postcard with your next transmission date.  Your physician recommends that you schedule a follow-up appointment in: 12 months with Dr.Klein

## 2013-08-28 NOTE — Progress Notes (Signed)
      Patient Care Team: Jerene Pitchobert E Pryor as PCP - General (Internal Medicine)   HPI  Kenneth [name] is a 73 y.o. male Seen in followup for CHB s/p PPM   This was initially implanted about 25 years ago underwent generator replacement most recently 2013.  Is of coronary artery disease.  No chest pain Chronic DOE  Past Medical History  Diagnosis Date  . Syncope   . AV block   . Overweight   . Hyperlipemia   . Pacemaker   . CAD (coronary artery disease)     Past Surgical History  Procedure Laterality Date  . Left eye removal  1957  . Pacemaker insertion  2003    Medtronic    Current Outpatient Prescriptions  Medication Sig Dispense Refill  . aspirin 81 MG tablet Take 81 mg by mouth daily.       Marland Kitchen. atenolol (TENORMIN) 100 MG tablet Take 100 mg by mouth daily.      Marland Kitchen. ibuprofen (ADVIL,MOTRIN) 200 MG tablet Take 400 mg by mouth every 6 (six) hours as needed. For pain      . losartan (COZAAR) 50 MG tablet Take 1 tablet by mouth daily.      Marland Kitchen. lovastatin (MEVACOR) 20 MG tablet Take 10 mg by mouth at bedtime.       . niacin (NIASPAN) 1000 MG CR tablet Take 1,000 mg by mouth 2 (two) times daily.       Marland Kitchen. omeprazole (PRILOSEC) 20 MG capsule Take 20 mg by mouth daily.      . tamsulosin (FLOMAX) 0.4 MG CAPS capsule Take 0.4 mg by mouth daily.       No current facility-administered medications for this visit.    No Known Allergies  Review of Systems negative except from HPI and PMH  Physical Exam BP 130/80  Pulse 71  Ht 5\' 4"  (1.626 m)  Wt 266 lb (120.657 kg)  BMI 45.64 kg/m2 Well developed and well nourished in no acute distress HENT normal E scleral and icterus clear Neck Supple JVP flat; carotids brisk and full Clear to ausculation Device pocket well healed; without hematoma or erythema.  There is no tethering   egular rate and rhythm, no murmurs gallops or rub Soft with active bowel sounds No clubbing cyanosis  Edema Alert and oriented, grossly normal motor and  sensory function Skin Warm and Dry  ECG  Sinus at 71 17/09/39  Assessment and  Plan  Intermittent complete heart block 0.6% vpacing at 50 bpm  Pacemaker-Medtronic The patient's device was interrogated.  The information was reviewed. No changes were made in the programming.      Dyslipidemia   I asked him to followup with his PCP regarding alternative therapy for his hypertriglyceridemia as opposed to niacin

## 2013-08-29 LAB — MDC_IDC_ENUM_SESS_TYPE_INCLINIC
Battery Impedance: 305 Ohm
Battery Voltage: 2.78 V
Date Time Interrogation Session: 20150407174316
Lead Channel Impedance Value: 561 Ohm
Lead Channel Pacing Threshold Pulse Width: 0.4 ms
Lead Channel Setting Sensing Sensitivity: 2.8 mV
MDC IDC MSMT BATTERY REMAINING LONGEVITY: 112 mo
MDC IDC MSMT LEADCHNL RA IMPEDANCE VALUE: 0 Ohm
MDC IDC MSMT LEADCHNL RV PACING THRESHOLD AMPLITUDE: 0.75 V
MDC IDC MSMT LEADCHNL RV SENSING INTR AMPL: 11.2 mV
MDC IDC SET LEADCHNL RV PACING AMPLITUDE: 2.5 V
MDC IDC SET LEADCHNL RV PACING PULSEWIDTH: 0.4 ms
MDC IDC STAT BRADY RV PERCENT PACED: 1 %

## 2013-09-12 ENCOUNTER — Encounter: Payer: Self-pay | Admitting: Internal Medicine

## 2013-10-17 DIAGNOSIS — G4733 Obstructive sleep apnea (adult) (pediatric): Secondary | ICD-10-CM | POA: Insufficient documentation

## 2013-11-29 ENCOUNTER — Ambulatory Visit (INDEPENDENT_AMBULATORY_CARE_PROVIDER_SITE_OTHER): Payer: Medicare Other | Admitting: *Deleted

## 2013-11-29 DIAGNOSIS — Z950 Presence of cardiac pacemaker: Secondary | ICD-10-CM

## 2013-11-29 DIAGNOSIS — I442 Atrioventricular block, complete: Secondary | ICD-10-CM

## 2013-11-29 NOTE — Progress Notes (Signed)
Remote pacemaker transmission.   

## 2013-12-01 LAB — MDC_IDC_ENUM_SESS_TYPE_REMOTE
Battery Impedance: 305 Ohm
Battery Voltage: 2.78 V
Date Time Interrogation Session: 20150709132238
Lead Channel Impedance Value: 0 Ohm
Lead Channel Pacing Threshold Pulse Width: 0.4 ms
Lead Channel Setting Sensing Sensitivity: 2.8 mV
MDC IDC MSMT BATTERY REMAINING LONGEVITY: 111 mo
MDC IDC MSMT LEADCHNL RV IMPEDANCE VALUE: 608 Ohm
MDC IDC MSMT LEADCHNL RV PACING THRESHOLD AMPLITUDE: 0.75 V
MDC IDC MSMT LEADCHNL RV SENSING INTR AMPL: 8 mV
MDC IDC SET LEADCHNL RV PACING AMPLITUDE: 2.5 V
MDC IDC SET LEADCHNL RV PACING PULSEWIDTH: 0.4 ms
MDC IDC STAT BRADY RV PERCENT PACED: 1 %

## 2013-12-31 ENCOUNTER — Encounter: Payer: Self-pay | Admitting: Internal Medicine

## 2014-01-25 DIAGNOSIS — IMO0002 Reserved for concepts with insufficient information to code with codable children: Secondary | ICD-10-CM | POA: Insufficient documentation

## 2014-03-04 ENCOUNTER — Ambulatory Visit (INDEPENDENT_AMBULATORY_CARE_PROVIDER_SITE_OTHER): Payer: Medicare Other | Admitting: *Deleted

## 2014-03-04 ENCOUNTER — Encounter: Payer: Self-pay | Admitting: Internal Medicine

## 2014-03-04 DIAGNOSIS — I442 Atrioventricular block, complete: Secondary | ICD-10-CM

## 2014-03-04 LAB — MDC_IDC_ENUM_SESS_TYPE_REMOTE
Battery Impedance: 330 Ohm
Battery Voltage: 2.78 V
Brady Statistic RV Percent Paced: 1 %
Lead Channel Impedance Value: 0 Ohm
Lead Channel Pacing Threshold Amplitude: 0.75 V
Lead Channel Setting Pacing Amplitude: 2.5 V
Lead Channel Setting Pacing Pulse Width: 0.4 ms
Lead Channel Setting Sensing Sensitivity: 2.8 mV
MDC IDC MSMT BATTERY REMAINING LONGEVITY: 109 mo
MDC IDC MSMT LEADCHNL RV IMPEDANCE VALUE: 608 Ohm
MDC IDC MSMT LEADCHNL RV PACING THRESHOLD PULSEWIDTH: 0.4 ms
MDC IDC MSMT LEADCHNL RV SENSING INTR AMPL: 16 mV
MDC IDC SESS DTM: 20151012143810

## 2014-03-04 NOTE — Progress Notes (Signed)
Remote pacemaker transmission.   

## 2014-03-27 ENCOUNTER — Encounter: Payer: Self-pay | Admitting: Cardiology

## 2014-04-01 DIAGNOSIS — N183 Chronic kidney disease, stage 3 unspecified: Secondary | ICD-10-CM | POA: Insufficient documentation

## 2014-04-01 DIAGNOSIS — E119 Type 2 diabetes mellitus without complications: Secondary | ICD-10-CM | POA: Insufficient documentation

## 2014-04-01 DIAGNOSIS — J449 Chronic obstructive pulmonary disease, unspecified: Secondary | ICD-10-CM | POA: Insufficient documentation

## 2014-05-02 ENCOUNTER — Encounter (HOSPITAL_COMMUNITY): Payer: Self-pay | Admitting: Internal Medicine

## 2014-06-06 ENCOUNTER — Ambulatory Visit (INDEPENDENT_AMBULATORY_CARE_PROVIDER_SITE_OTHER): Payer: Medicare Other | Admitting: *Deleted

## 2014-06-06 DIAGNOSIS — I442 Atrioventricular block, complete: Secondary | ICD-10-CM

## 2014-06-06 NOTE — Progress Notes (Signed)
Remote pacemaker transmission.   

## 2014-06-10 LAB — MDC_IDC_ENUM_SESS_TYPE_REMOTE
Battery Remaining Longevity: 105 mo
Battery Voltage: 2.78 V
Lead Channel Impedance Value: 632 Ohm
Lead Channel Pacing Threshold Amplitude: 0.75 V
Lead Channel Pacing Threshold Pulse Width: 0.4 ms
Lead Channel Sensing Intrinsic Amplitude: 11.2 mV
Lead Channel Setting Pacing Pulse Width: 0.4 ms
MDC IDC MSMT BATTERY IMPEDANCE: 379 Ohm
MDC IDC MSMT LEADCHNL RA IMPEDANCE VALUE: 0 Ohm
MDC IDC SESS DTM: 20160114191342
MDC IDC SET LEADCHNL RV PACING AMPLITUDE: 2.5 V
MDC IDC SET LEADCHNL RV SENSING SENSITIVITY: 4 mV
MDC IDC STAT BRADY RV PERCENT PACED: 0 %

## 2014-07-26 DIAGNOSIS — F419 Anxiety disorder, unspecified: Secondary | ICD-10-CM | POA: Insufficient documentation

## 2014-08-08 ENCOUNTER — Encounter: Payer: Self-pay | Admitting: Cardiology

## 2014-08-20 ENCOUNTER — Encounter: Payer: Self-pay | Admitting: Internal Medicine

## 2014-08-23 DIAGNOSIS — I2699 Other pulmonary embolism without acute cor pulmonale: Secondary | ICD-10-CM | POA: Insufficient documentation

## 2014-08-29 ENCOUNTER — Encounter: Payer: Medicare Other | Admitting: Internal Medicine

## 2014-09-02 DIAGNOSIS — Z5181 Encounter for therapeutic drug level monitoring: Secondary | ICD-10-CM | POA: Insufficient documentation

## 2014-09-23 ENCOUNTER — Observation Stay (HOSPITAL_COMMUNITY): Payer: Medicare Other

## 2014-09-23 ENCOUNTER — Inpatient Hospital Stay (HOSPITAL_COMMUNITY): Payer: Medicare Other

## 2014-09-23 ENCOUNTER — Inpatient Hospital Stay (HOSPITAL_COMMUNITY)
Admission: EM | Admit: 2014-09-23 | Discharge: 2014-09-24 | DRG: 292 | Disposition: A | Discharge status: Home / Self Care | Payer: Medicare Other | Source: Other Acute Inpatient Hospital | Attending: Cardiovascular Disease | Admitting: Cardiovascular Disease

## 2014-09-23 ENCOUNTER — Other Ambulatory Visit (HOSPITAL_COMMUNITY): Payer: Medicare Other

## 2014-09-23 DIAGNOSIS — I2510 Atherosclerotic heart disease of native coronary artery without angina pectoris: Secondary | ICD-10-CM | POA: Diagnosis present

## 2014-09-23 DIAGNOSIS — I442 Atrioventricular block, complete: Secondary | ICD-10-CM | POA: Diagnosis present

## 2014-09-23 DIAGNOSIS — D72829 Elevated white blood cell count, unspecified: Secondary | ICD-10-CM | POA: Diagnosis present

## 2014-09-23 DIAGNOSIS — R0609 Other forms of dyspnea: Secondary | ICD-10-CM | POA: Diagnosis present

## 2014-09-23 DIAGNOSIS — T380X5A Adverse effect of glucocorticoids and synthetic analogues, initial encounter: Secondary | ICD-10-CM | POA: Diagnosis present

## 2014-09-23 DIAGNOSIS — Z8679 Personal history of other diseases of the circulatory system: Secondary | ICD-10-CM

## 2014-09-23 DIAGNOSIS — J441 Chronic obstructive pulmonary disease with (acute) exacerbation: Secondary | ICD-10-CM | POA: Diagnosis present

## 2014-09-23 DIAGNOSIS — E785 Hyperlipidemia, unspecified: Secondary | ICD-10-CM | POA: Diagnosis present

## 2014-09-23 DIAGNOSIS — I5033 Acute on chronic diastolic (congestive) heart failure: Principal | ICD-10-CM | POA: Diagnosis present

## 2014-09-23 DIAGNOSIS — I5031 Acute diastolic (congestive) heart failure: Secondary | ICD-10-CM

## 2014-09-23 DIAGNOSIS — Z86711 Personal history of pulmonary embolism: Secondary | ICD-10-CM | POA: Diagnosis not present

## 2014-09-23 DIAGNOSIS — Z87891 Personal history of nicotine dependence: Secondary | ICD-10-CM | POA: Diagnosis not present

## 2014-09-23 DIAGNOSIS — R410 Disorientation, unspecified: Secondary | ICD-10-CM

## 2014-09-23 DIAGNOSIS — R0600 Dyspnea, unspecified: Secondary | ICD-10-CM | POA: Diagnosis not present

## 2014-09-23 DIAGNOSIS — E119 Type 2 diabetes mellitus without complications: Secondary | ICD-10-CM | POA: Diagnosis present

## 2014-09-23 DIAGNOSIS — N183 Chronic kidney disease, stage 3 (moderate): Secondary | ICD-10-CM | POA: Diagnosis not present

## 2014-09-23 DIAGNOSIS — Z950 Presence of cardiac pacemaker: Secondary | ICD-10-CM

## 2014-09-23 DIAGNOSIS — Z8249 Family history of ischemic heart disease and other diseases of the circulatory system: Secondary | ICD-10-CM | POA: Diagnosis not present

## 2014-09-23 DIAGNOSIS — R7989 Other specified abnormal findings of blood chemistry: Secondary | ICD-10-CM

## 2014-09-23 LAB — BASIC METABOLIC PANEL
Anion gap: 11 (ref 5–15)
BUN: 23 mg/dL — ABNORMAL HIGH (ref 6–20)
CHLORIDE: 104 mmol/L (ref 101–111)
CO2: 21 mmol/L — ABNORMAL LOW (ref 22–32)
Calcium: 8.7 mg/dL — ABNORMAL LOW (ref 8.9–10.3)
Creatinine, Ser: 1.62 mg/dL — ABNORMAL HIGH (ref 0.61–1.24)
GFR calc Af Amer: 47 mL/min — ABNORMAL LOW (ref >60)
GFR, EST NON AFRICAN AMERICAN: 40 mL/min — AB (ref >60)
Glucose, Bld: 247 mg/dL — ABNORMAL HIGH (ref 70–99)
POTASSIUM: 4.5 mmol/L (ref 3.5–5.1)
SODIUM: 136 mmol/L (ref 135–145)

## 2014-09-23 LAB — MAGNESIUM: MAGNESIUM: 1.2 mg/dL — AB (ref 1.7–2.4)

## 2014-09-23 LAB — CBC WITH DIFFERENTIAL/PLATELET
Basophils Absolute: 0 10*3/uL (ref 0.0–0.1)
Basophils Relative: 0 % (ref 0–1)
EOS PCT: 0 % (ref 0–5)
Eosinophils Absolute: 0 10*3/uL (ref 0.0–0.7)
HCT: 48.3 % (ref 39.0–52.0)
Hemoglobin: 16.5 g/dL (ref 13.0–17.0)
LYMPHS PCT: 7 % — AB (ref 12–46)
Lymphs Abs: 0.9 10*3/uL (ref 0.7–4.0)
MCH: 28.8 pg (ref 26.0–34.0)
MCHC: 34.2 g/dL (ref 30.0–36.0)
MCV: 84.3 fL (ref 78.0–100.0)
MONO ABS: 0.1 10*3/uL (ref 0.1–1.0)
MONOS PCT: 1 % — AB (ref 3–12)
Neutro Abs: 12.2 10*3/uL — ABNORMAL HIGH (ref 1.7–7.7)
Neutrophils Relative %: 92 % — ABNORMAL HIGH (ref 43–77)
Platelets: 105 10*3/uL — ABNORMAL LOW (ref 150–400)
RBC: 5.73 MIL/uL (ref 4.22–5.81)
RDW: 14.2 % (ref 11.5–15.5)
WBC: 13.2 10*3/uL — AB (ref 4.0–10.5)

## 2014-09-23 LAB — TROPONIN I
TROPONIN I: 0.08 ng/mL — AB (ref <0.031)
Troponin I: 0.11 ng/mL — ABNORMAL HIGH (ref <0.031)
Troponin I: 0.08 ng/mL — ABNORMAL HIGH (ref <0.031)

## 2014-09-23 LAB — TSH: TSH: 1.831 u[IU]/mL (ref 0.350–4.500)

## 2014-09-23 LAB — PROTIME-INR
INR: 1.44 (ref 0.00–1.49)
PROTHROMBIN TIME: 17.7 s — AB (ref 11.6–15.2)

## 2014-09-23 LAB — BRAIN NATRIURETIC PEPTIDE: B Natriuretic Peptide: 332.6 pg/mL — ABNORMAL HIGH (ref 0.0–100.0)

## 2014-09-23 LAB — HEPARIN LEVEL (UNFRACTIONATED): Heparin Unfractionated: 0.23 IU/mL — ABNORMAL LOW (ref 0.30–0.70)

## 2014-09-23 LAB — APTT: APTT: 33 s (ref 24–37)

## 2014-09-23 MED ORDER — ATENOLOL 100 MG PO TABS
100.0000 mg | ORAL_TABLET | Freq: Every day | ORAL | Status: DC
  Administered 2014-09-23 – 2014-09-24 (×2): 100 mg via ORAL
  Filled 2014-09-23 (×2): qty 1

## 2014-09-23 MED ORDER — PANTOPRAZOLE SODIUM 40 MG PO TBEC
40.0000 mg | DELAYED_RELEASE_TABLET | Freq: Every day | ORAL | Status: DC
  Administered 2014-09-23 – 2014-09-24 (×2): 40 mg via ORAL
  Filled 2014-09-23 (×2): qty 1

## 2014-09-23 MED ORDER — TAMSULOSIN HCL 0.4 MG PO CAPS
0.4000 mg | ORAL_CAPSULE | Freq: Every day | ORAL | Status: DC
  Administered 2014-09-23 – 2014-09-24 (×2): 0.4 mg via ORAL
  Filled 2014-09-23 (×2): qty 1

## 2014-09-23 MED ORDER — ONDANSETRON HCL 4 MG/2ML IJ SOLN: 4.0000 mg | Freq: Four times a day (QID) | INTRAMUSCULAR | Status: DC | PRN

## 2014-09-23 MED ORDER — NITROGLYCERIN 0.4 MG SL SUBL: 0.4000 mg | SUBLINGUAL_TABLET | SUBLINGUAL | Status: DC | PRN

## 2014-09-23 MED ORDER — NIACIN ER (ANTIHYPERLIPIDEMIC) 500 MG PO TBCR
1000.0000 mg | EXTENDED_RELEASE_TABLET | Freq: Two times a day (BID) | ORAL | Status: DC
  Administered 2014-09-23 – 2014-09-24 (×3): 1000 mg via ORAL
  Filled 2014-09-23 (×4): qty 2

## 2014-09-23 MED ORDER — CETYLPYRIDINIUM CHLORIDE 0.05 % MT LIQD
7.0000 mL | Freq: Two times a day (BID) | OROMUCOSAL | Status: DC
Start: 2014-09-23 — End: 2014-09-24
  Administered 2014-09-23 – 2014-09-24 (×3): 7 mL via OROMUCOSAL

## 2014-09-23 MED ORDER — SODIUM CHLORIDE 0.9 % IJ SOLN: 3.0000 mL | INTRAMUSCULAR | Status: DC | PRN

## 2014-09-23 MED ORDER — SODIUM CHLORIDE 0.9 % IV SOLN: 250.0000 mL | INTRAVENOUS | Status: DC | PRN

## 2014-09-23 MED ORDER — ACETAMINOPHEN 325 MG PO TABS: 650.0000 mg | ORAL_TABLET | ORAL | Status: DC | PRN

## 2014-09-23 MED ORDER — LOSARTAN POTASSIUM 50 MG PO TABS
50.0000 mg | ORAL_TABLET | Freq: Every day | ORAL | Status: DC
Start: 2014-09-23 — End: 2014-09-24
  Administered 2014-09-24: 50 mg via ORAL
  Filled 2014-09-23 (×2): qty 1

## 2014-09-23 MED ORDER — PRAVASTATIN SODIUM 10 MG PO TABS
10.0000 mg | ORAL_TABLET | Freq: Every day | ORAL | Status: DC
  Administered 2014-09-23: 10 mg via ORAL
  Filled 2014-09-23 (×2): qty 1

## 2014-09-23 MED ORDER — ASPIRIN EC 81 MG PO TBEC: 81.0000 mg | DELAYED_RELEASE_TABLET | Freq: Every day | ORAL | Status: DC

## 2014-09-23 MED ORDER — ASPIRIN EC 81 MG PO TBEC
81.0000 mg | DELAYED_RELEASE_TABLET | Freq: Every day | ORAL | Status: DC
  Administered 2014-09-23 – 2014-09-24 (×2): 81 mg via ORAL
  Filled 2014-09-23 (×2): qty 1

## 2014-09-23 MED ORDER — SODIUM CHLORIDE 0.9 % IJ SOLN
3.0000 mL | Freq: Two times a day (BID) | INTRAMUSCULAR | Status: DC
  Administered 2014-09-23 (×2): 3 mL via INTRAVENOUS

## 2014-09-23 MED ORDER — WARFARIN SODIUM 5 MG PO TABS
5.0000 mg | ORAL_TABLET | Freq: Once | ORAL | Status: AC
  Administered 2014-09-23: 5 mg via ORAL
  Filled 2014-09-23: qty 1

## 2014-09-23 MED ORDER — WARFARIN - PHARMACIST DOSING INPATIENT: Freq: Every day | Status: DC

## 2014-09-23 MED ORDER — PREDNISONE 20 MG PO TABS
40.0000 mg | ORAL_TABLET | Freq: Every day | ORAL | Status: DC
  Administered 2014-09-23 – 2014-09-24 (×2): 40 mg via ORAL
  Filled 2014-09-23 (×3): qty 2

## 2014-09-23 MED ORDER — HEPARIN (PORCINE) IN NACL 100-0.45 UNIT/ML-% IJ SOLN
1450.0000 [IU]/h | INTRAMUSCULAR | Status: DC
  Administered 2014-09-23: 1300 [IU]/h via INTRAVENOUS
  Administered 2014-09-24: 1450 [IU]/h via INTRAVENOUS
  Filled 2014-09-23 (×4): qty 250

## 2014-09-23 NOTE — Progress Notes (Signed)
  Echocardiogram 2D Echocardiogram has been performed.  Delcie RochENNINGTON, Chanise Habeck 09/23/2014, 4:55 PM

## 2014-09-23 NOTE — Care Management Note (Signed)
Case Management Note  Patient Details  Name: Kenneth [name] MRN: [id number] Date of Birth: [birth year]  Subjective/Objective:     Mr. [name] is a 74 yo man with PMH of COPD, CAD, diastolic HF on home lasix, PPM for complete heart block 25 years ago who presented with leg swelling and dyspnea. Limited data on transfer but he appears to have COPD vs. Diastolic heart failure. Neck veins mildly up and he received prednisone and IV lasix.               Action/Plan: 1. observation 2. trend cardiac biomarkers, on telemetry 3. chest x-ray, BNP, bmp 4. lasix 40 mg Iv x1 5. pharmacy to dose heparin given INR subtherapeutic and recent PE, continue warfarin also 6. Prednisone 40 mg x3 days 7. Echocardiogram 8. Interrogate PPM  Expected Discharge Date:  09/27/14               Expected Discharge Plan:  Home w Home Health Services  In-House Referral:     Discharge planning Services  CM Consult  Post Acute Care Choice:    Choice offered to:     DME Arranged:    DME Agency:     HH Arranged:    HH Agency:     Status of Service:  In process, will continue to follow  Medicare Important Message Given:    Date Medicare IM Given:    Medicare IM give by:    Date Additional Medicare IM Given:    Additional Medicare Important Message give by:     If discussed at Long Length of Stay Meetings, dates discussed:    Additional Comments:  Oletta CohnWood, Zhanna Melin, RN 09/23/2014, 2:45 PM

## 2014-09-23 NOTE — Progress Notes (Signed)
ANTICOAGULATION CONSULT NOTE - Follow Up Consult  Pharmacy Consult for heparin Indication: PE  Allergies  Allergen Reactions  . Lovastatin Other (See Comments)    myalgias  . Lisinopril     itching    Patient Measurements: Height: 5\' 3"  (160 cm) Weight: 254 lb 9.6 oz (115.486 kg) IBW/kg (Calculated) : 56.9 Heparin Dosing Weight: 85 kg  Vital Signs: Temp: 97.5 F (36.4 C) (05/02 1454) Temp Source: Oral (05/02 1454) BP: 149/81 mmHg (05/02 1454) Pulse Rate: 72 (05/02 1454)  Labs:  Recent Labs  09/23/14 0914  HGB 16.5  HCT 48.3  PLT 105*  APTT 33  LABPROT 17.7*  INR 1.44  CREATININE 1.62*  TROPONINI 0.11*    Estimated Creatinine Clearance: 46.1 mL/min (by C-G formula based on Cr of 1.62).   Medications:  Scheduled:  . antiseptic oral rinse  7 mL Mouth Rinse BID  . aspirin EC  81 mg Oral Daily  . atenolol  100 mg Oral Daily  . losartan  50 mg Oral Daily  . niacin  1,000 mg Oral BID  . pantoprazole  40 mg Oral Daily  . pravastatin  10 mg Oral q1800  . predniSONE  40 mg Oral QAC breakfast  . sodium chloride  3 mL Intravenous Q12H  . tamsulosin  0.4 mg Oral Daily  . warfarin  5 mg Oral ONCE-1800  . Warfarin - Pharmacist Dosing Inpatient   Does not apply q1800   Infusions:  . heparin 1,300 Units/hr (09/23/14 0944)    Assessment: 74 yo male with PE is currently on subtherapeutic heparin.  Heparin level is 0.23.  Goal of Therapy:  Heparin level 0.3-0.7 units/ml Monitor platelets by anticoagulation protocol: Yes   Plan:  - Increase heparin to 1450 units/hr. 8hr heparin level  Kaylen Nghiem, Tsz-Yin 09/23/2014,3:21 PM

## 2014-09-23 NOTE — Progress Notes (Signed)
ANTICOAGULATION CONSULT NOTE - Initial Consult  Pharmacy Consult for Heparin and Coumadin  Indication: pulmonary embolus  No Known Allergies  Patient Measurements: Height: 5\' 3"  (160 cm) Weight: 254 lb 9.6 oz (115.486 kg) IBW/kg (Calculated) : 56.9 Heparin Dosing Weight: 85 kg  Vital Signs: Temp: 97.3 F (36.3 C) (05/02 0616) Temp Source: Oral (05/02 0616) BP: 153/93 mmHg (05/02 0616) Pulse Rate: 74 (05/02 0616)  Labs [facility] WBC 14.1 Hgb 16.1 Hct 48.6 Plt 113  SCr 1.9  INR 1.6  No results for input(s): HGB, HCT, PLT, APTT, LABPROT, INR, HEPARINUNFRC, CREATININE, CKTOTAL, CKMB, TROPONINI in the last 72 hours.  CrCl cannot be calculated (Patient has no serum creatinine result on file.).   Medical History: Past Medical History  Diagnosis Date  . Syncope   . AV block   . Overweight(278.02)   . Hyperlipemia   . Pacemaker   . CAD (coronary artery disease)     Medications:  Zantac Lasix  Atenolol  Buspar  Duoneb  Losartan   Coumadin 2.5 mg daily  Assessment: 74 yo male admitted with SOB/DOE,  H/o recent PE and subtherapeutic INR , for anticoagulation.    Goal of Therapy:  INR 2-3 Heparin level 0.3-0.7 units/ml Monitor platelets by anticoagulation protocol: Yes   Plan:  Start heparin 1300 units/hr Check heparin level in 8 hours.  Coumadin 5 mg today  Eddie Candlebbott, Latonyia Lopata Vernon 09/23/2014,6:21 AM

## 2014-09-23 NOTE — Discharge Instructions (Signed)

## 2014-09-23 NOTE — Progress Notes (Signed)
Pt arrived to floor via EMS transport. Report received from [facility] at [facility]. Pt A/Ox4, VSS. Pt in NAD, on 2Lnc. No complaints of pain, no requests at this time. NSR in 70s on telemetry, pt with pacemaker. Pt oriented to room and floor, call bell within reach. Leeann MustJacob Kelly with cardiology paged for admitting orders. Will continue to monitor. Huel Coventryosenberger, Chonda Baney A, RN

## 2014-09-23 NOTE — Progress Notes (Addendum)
Pt came to door and told NT that he had woken up on the floor. Pt stated he did not know how he got to floor. Pt with no complaints of pain, able to move all extremities, no bruising or skin tears noted. Pt alert to self and date, does not remember where he is or how he got here. Pt IV out and bleeding from IV site, pt bather, new gown placed and put in bed. Bed alarm on, falls risk bracelet applied and yellow socks applied, call bell within reach.   IV team paged for new IV site and pharmacy notified that Heparin gtt stopped at this time d/t no IV access.   Spoke with patient's wife and son, wife states pt occasionally gets confused and did not remember how he got to the hospital in [place].     09/23/14 1915  What Happened  Was fall witnessed? No  Was patient injured? Unsure  Patient found other (Comment) (walking to door by NT)  Found by Staff-comment (Cari, NT)  Stated prior activity other (comment) (pt does not remember)     09/23/14 1915  Follow Up  MD notified Barrett, MD  Time MD notified [id number]  Additional tests Yes-comment (head CT)  Simple treatment Other (comment) (gauze to IV site)    09/23/14 1941  Follow Up  Family notified Yes-comment  Time family notified 1942

## 2014-09-23 NOTE — Progress Notes (Addendum)
Patient admitted early this morning, plans noted. Trending enzyme. Will arrange medtronic device interrogation.  Ramond DialSigned, Hao Meng PA Pager: [phone]  Patient examined chart reviewed.  COPD exacerbation better.  Neck and trapezius muscle pain better No clear how much CHF is playing role AICD deep in left pre pectoral area.  Will interrogate  He is chronically On oxygen continue and follow sats  Possible d/c am if continues to be better  [employer] CorporationPeter Remer Couse

## 2014-09-23 NOTE — Progress Notes (Signed)
Utilization Review completed in error.  Will undo UR.

## 2014-09-23 NOTE — H&P (Addendum)
Kenneth [name] is an 74 y.o. male.    Chief Complaint: dyspnea on exertion, leg swelling Primary Cardiologist: Graciela HusbandsKlein HPI: Kenneth [name] is a 74 yo man with PMH of COPD, CAD, complete heart block with PPM with Medtronic device for 25 years, PE diagnosed as outpatient 1-2 months ago treated with warfarin who presented to the ER with dyspnea and leg swelling and found to have mildly elevated troponin at 0.146 and concern for heart failure vs. COPD. Given elevated troponin they called [facility] where patient preferred to be transferred. In the ER he received methylprednisolone 125 mg x1 and IV lasix and subsequently felt much improved with his breathing. On arrival, he was asymptomatic in good spirits. He denied recent illness, travel or infectious symptoms. No chest pain. He's been having leg swelling for a few weeks. He takes lasix at home daily. No syncope.   Past Medical History  Diagnosis Date  . Syncope   . AV block   . Overweight(278.02)   . Hyperlipemia   . Pacemaker   . CAD (coronary artery disease)     Past Surgical History  Procedure Laterality Date  . Left eye removal  1957  . Pacemaker insertion  2003    Medtronic  . Permanent pacemaker generator change N/A 06/23/2011    Procedure: PERMANENT PACEMAKER GENERATOR CHANGE;  Surgeon: Duke SalviaSteven C Klein, MD;  [facility];  Service: Cardiovascular;  Laterality: N/A;    Family History  Problem Relation Age of Onset  . Heart disease Mother    Social History:  reports that he quit smoking about 23 years ago. His smoking use included Cigarettes. He does not have any smokeless tobacco history on file. He reports that he does not drink alcohol or use illicit drugs.  Allergies: No Known Allergies  Medications Prior to Admission  Medication Sig Dispense Refill  . aspirin 81 MG tablet Take 81 mg by mouth daily.     Marland Kitchen. atenolol (TENORMIN) 100 MG tablet Take 100 mg by mouth daily.    Marland Kitchen. ibuprofen (ADVIL,MOTRIN) 200 MG tablet Take 400 mg by  mouth every 6 (six) hours as needed. For pain    . losartan (COZAAR) 50 MG tablet Take 1 tablet by mouth daily.    Marland Kitchen. lovastatin (MEVACOR) 20 MG tablet Take 10 mg by mouth at bedtime.     . niacin (NIASPAN) 1000 MG CR tablet Take 1,000 mg by mouth 2 (two) times daily.     Marland Kitchen. omeprazole (PRILOSEC) 20 MG capsule Take 20 mg by mouth daily.    . tamsulosin (FLOMAX) 0.4 MG CAPS capsule Take 0.4 mg by mouth daily.      No results found for this or any previous visit (from the past 48 hour(s)). No results found.  Review of Systems  Constitutional: Negative for fever, chills and weight loss.  HENT: Positive for hearing loss. Negative for ear pain and tinnitus.   Eyes: Negative for double vision and photophobia.  Respiratory: Positive for shortness of breath. Negative for hemoptysis and sputum production.   Cardiovascular: Positive for leg swelling. Negative for chest pain and palpitations.  Gastrointestinal: Negative for heartburn, nausea and vomiting.  Musculoskeletal: Negative for myalgias and neck pain.  Skin: Negative for rash.  Neurological: Negative for dizziness, tingling and tremors.  Endo/Heme/Allergies: Negative for polydipsia.  Psychiatric/Behavioral: Negative for suicidal ideas and substance abuse.    Blood pressure 139/98, pulse 80, temperature 98.2 F (36.8 C), temperature source Oral, resp. rate 22, height 5' (1.524 m), weight  115.486 kg (254 lb 9.6 oz), SpO2 93 %. Physical Exam  Nursing note and vitals reviewed. Constitutional: He is oriented to person, place, and time. He appears well-developed and well-nourished. No distress.  HENT:  Head: Normocephalic and atraumatic.  Nose: Nose normal.  Mouth/Throat: No oropharyngeal exudate.  Eyes: Conjunctivae and EOM are normal. Pupils are equal, round, and reactive to light. No scleral icterus.  Neck: Normal range of motion. Neck supple. JVD present. No tracheal deviation present.  3-4 cm above clavicle slight HJR  Cardiovascular:  Normal rate, regular rhythm, normal heart sounds and intact distal pulses.  Exam reveals no gallop.   No murmur heard. Respiratory: Effort normal and breath sounds normal. No respiratory distress. He has no wheezes.  GI: Soft. Bowel sounds are normal. He exhibits no distension. There is no tenderness.  Musculoskeletal: Normal range of motion. He exhibits edema.  Neurological: He is alert and oriented to person, place, and time. No cranial nerve deficit. Coordination normal.  Skin: Skin is warm and dry. No rash noted. He is not diaphoretic. No erythema.  Psychiatric: He has a normal mood and affect. His behavior is normal. Thought content normal.   ECG reviewed; sinus rhythm, Poor r-wave progression, sinus tachycardia Labs reviewed; wbc 14.1, h/h 16/48.1, plt 113, na 140, K 4.6, Cl 104, cow 24, bun/cr 28/1.9, calcium 8.9, negative influenza A/B INR 1.6 Trop 0.148 (ULN 0.056) abg 7.46, 30, 61, 92% I was told he had a BNP of > 800 but not seen on chart.   Assessment/Plan Kenneth [name] is a 74 yo man with PMH of COPD, CAD, diastolic HF on home lasix, PPM for complete heart block 25 years ago who presented with leg swelling and dyspnea. Limited data on transfer but he appears to have COPD vs. Diastolic heart failure. Neck veins mildly up and he received prednisone and IV lasix. Will trend/repeat troponins here and continue home medications.  Problem List Dyspnea on exertion - acute diastolic heart failure Recent PE COPD - ? Exacerbation Acute diastolic heart failure  Chronic Kidney Disease Stage III 1. observation 2. trend cardiac biomarkers, on telemetry 3. chest x-ray, BNP, bmp 4. lasix 40 mg Iv x1 5. pharmacy to dose heparin given INR subtherapeutic and recent PE, continue warfarin also 6. Prednisone 40 mg x3 days 7. Echocardiogram 8. Interrogate PPM Lelaina Oatis 09/23/2014, 5:27 AM

## 2014-09-24 ENCOUNTER — Telehealth: Payer: Self-pay | Admitting: Internal Medicine

## 2014-09-24 DIAGNOSIS — Z8679 Personal history of other diseases of the circulatory system: Secondary | ICD-10-CM

## 2014-09-24 DIAGNOSIS — I2510 Atherosclerotic heart disease of native coronary artery without angina pectoris: Secondary | ICD-10-CM

## 2014-09-24 LAB — CBC
HCT: 49.1 % (ref 39.0–52.0)
Hemoglobin: 16.2 g/dL (ref 13.0–17.0)
MCH: 28.4 pg (ref 26.0–34.0)
MCHC: 33 g/dL (ref 30.0–36.0)
MCV: 86 fL (ref 78.0–100.0)
Platelets: 88 10*3/uL — ABNORMAL LOW (ref 150–400)
RBC: 5.71 MIL/uL (ref 4.22–5.81)
RDW: 14.5 % (ref 11.5–15.5)
WBC: 22.1 10*3/uL — ABNORMAL HIGH (ref 4.0–10.5)

## 2014-09-24 LAB — HEMOGLOBIN A1C
Hgb A1c MFr Bld: 7 % — ABNORMAL HIGH (ref 4.8–5.6)
Mean Plasma Glucose: 154 mg/dL

## 2014-09-24 LAB — PROTIME-INR
INR: 1.39 (ref 0.00–1.49)
PROTHROMBIN TIME: 17.2 s — AB (ref 11.6–15.2)

## 2014-09-24 LAB — GLUCOSE, CAPILLARY: Glucose-Capillary: 206 mg/dL — ABNORMAL HIGH (ref 70–99)

## 2014-09-24 LAB — BASIC METABOLIC PANEL
Anion gap: 11 (ref 5–15)
BUN: 27 mg/dL — ABNORMAL HIGH (ref 6–20)
CALCIUM: 8.8 mg/dL — AB (ref 8.9–10.3)
CHLORIDE: 107 mmol/L (ref 101–111)
CO2: 21 mmol/L — AB (ref 22–32)
Creatinine, Ser: 1.41 mg/dL — ABNORMAL HIGH (ref 0.61–1.24)
GFR calc non Af Amer: 48 mL/min — ABNORMAL LOW (ref >60)
GFR, EST AFRICAN AMERICAN: 56 mL/min — AB (ref >60)
Glucose, Bld: 169 mg/dL — ABNORMAL HIGH (ref 70–99)
Potassium: 4.7 mmol/L (ref 3.5–5.1)
Sodium: 139 mmol/L (ref 135–145)

## 2014-09-24 MED ORDER — PREDNISONE 20 MG PO TABS: ORAL_TABLET | ORAL | Status: DC

## 2014-09-24 MED ORDER — PREDNISONE 20 MG PO TABS: 40.0000 mg | ORAL_TABLET | Freq: Every day | ORAL | Status: DC
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
## 2014-09-24 NOTE — Progress Notes (Signed)
RN assessed pt and IV out on floor. Pt bathed and heparin restarted and IV site wrapped with kerlex. Pharmacy notified that heparin gtt had been stopped for at least 30 minutes and restarted.

## 2014-09-24 NOTE — Discharge Summary (Signed)
Discharge Summary   Patient ID: Kenneth [name],  MRN: [id number], DOB/AGE: [birth year] 73 y.o.  Admit date: 09/23/2014 Discharge date: 09/24/2014  Primary Care Provider: PRYOR,ROBERT E Primary Cardiologist: Dr. Graciela HusbandsKlein  Discharge Diagnoses Principal Problem:   Acute diastolic heart failure Active Problems:   Coronary artery disease involving native coronary artery   History of complete heart block   Allergies Allergies  Allergen Reactions  . Lovastatin Other (See Comments)    myalgias  . Lisinopril     itching    Procedures  Echocardiogram 09/23/2014 LV EF: 50% -  55%  ------------------------------------------------------------------- Indications:   Dyspnea 786.09.  ------------------------------------------------------------------- History:  PMH:  Coronary artery disease. Risk factors: Diastolic CHF. Dyslipidemia.  ------------------------------------------------------------------- Study Conclusions  - Left ventricle: The cavity size was normal. Systolic function was normal. The estimated ejection fraction was in the range of 50% to 55%. Regional wall motion abnormalities cannot be excluded. Doppler parameters are consistent with abnormal left ventricular relaxation (grade 1 diastolic dysfunction). - Left atrium: The atrium was mildly dilated.    Hospital Course  The patient is a 74 year old male with past medical history of COPD, CAD, h/o complete heart block with Medtronic PPM, recently diagnosed PE started on Coumadin who presented to ED on 09/23/2014 with dyspnea, lower extremity swelling and found to have mildly elevated troponin of 0.146. His symptom was concerning for heart failure versus possible COPD exacerbation.  He denies any recent illness, recent travel or infectious symptoms. He was given prednisone and IV Lasix in the ED before transfer [facility].   It was felt his dyspnea on exertion is  likely acute diastolic heart failure. Patient symptom improved with IV Lasix and prednisone. Medtronic interrogated his device on 09/23/2014, his PPM was functioning normally. 0% ventricular pacing. 0 events. Echocardiogram was obtained on 09/23/2014 which showed EF 50-55%, grade 1 diastolic dysfunction. He does have some leukocytosis with white blood cell count increased to 22 after steroid initiation. The patient has been afebrile throughout the entire hospitalization. His INR has been therapeutic during this admission, he states he checked his INR at his PCPs office and will follow-up with the Coumadin level closely after discharge. Of note, during this admission, he was also diagnosed with diabetes with hemoglobin A1c of 7.1. I have instructed the patient to discuss with his PCP regarding medical management.  Patient was seen in the morning of 09/24/2014, at which time he denies any significant shortness of breath or chest discomfort. He is deemed stable for discharge from cardiology perspective. I will continue 1 more day of prednisone. He has a follow-up with Dr. Graciela HusbandsKlein in 2 days for pacemaker check.   Discharge Vitals Blood pressure 145/74, pulse 67, temperature 98 F (36.7 C), temperature source Oral, resp. rate 20, height 5\' 3"  (1.6 m), weight 258 lb 8 oz (117.255 kg), SpO2 96 %.  Filed Weights   09/23/14 0414 09/24/14 0609  Weight: 254 lb 9.6 oz (115.486 kg) 258 lb 8 oz (117.255 kg)    Labs  CBC  Recent Labs  09/23/14 0914 09/24/14 0602  WBC 13.2* 22.1*  NEUTROABS 12.2*  --   HGB 16.5 16.2  HCT 48.3 49.1  MCV 84.3 86.0  PLT 105* 88*   Basic Metabolic Panel  Recent Labs  09/23/14 0914 09/24/14 0602  NA 136 139  K 4.5 4.7  CL 104 107  CO2 21* 21*  GLUCOSE 247* 169*  BUN 23* 27*  CREATININE 1.62* 1.41*  CALCIUM  8.7* 8.8*  MG 1.2*  --    Cardiac Enzymes  Recent Labs  09/23/14 0914 09/23/14 1511 09/23/14 2127  TROPONINI 0.11* 0.08* 0.08*   Hemoglobin A1C  Recent  Labs  09/23/14 0914  HGBA1C 7.0*   Thyroid Function Tests  Recent Labs  09/23/14 0914  TSH 1.831    Disposition  Pt is being discharged home today in good condition.  Follow-up Plans & Appointments      Follow-up Information    Follow up with Jerene Pitch, MD.   Specialty:  Internal Medicine   Why:  Appt. on May 6 @ 11;00 am. Discuss with PCP regarding management of diabetes.    Contact information:   [facility] [address] [place] [address] [phone]       Follow up with Sherryl Manges, MD On 09/26/2014.   Specialty:  Cardiology   Why:  12:30pm. Pacemaker check   Contact information:   [address] [place] [address] [phone]       Discharge Medications    Medication List    TAKE these medications        atenolol 100 MG tablet  Commonly known as:  TENORMIN  Take 100 mg by mouth daily.     BUSPAR PO  Take by mouth.     furosemide 20 MG tablet  Commonly known as:  LASIX  Take 40 mg by mouth every morning.     ibuprofen 200 MG tablet  Commonly known as:  ADVIL,MOTRIN  Take 400 mg by mouth every 6 (six) hours as needed. For pain     losartan 50 MG tablet  Commonly known as:  COZAAR  Take 1 tablet by mouth daily.     lovastatin 20 MG tablet  Commonly known as:  MEVACOR  Take 10 mg by mouth at bedtime.     niacin 1000 MG CR tablet  Commonly known as:  NIASPAN  Take 1,000 mg by mouth 2 (two) times daily.     omeprazole 20 MG capsule  Commonly known as:  PRILOSEC  Take 20 mg by mouth daily.     predniSONE 20 MG tablet  Commonly known as:  DELTASONE  Take 2 tablets (40 mg total) by mouth daily before breakfast.     warfarin 2.5 MG tablet  Commonly known as:  COUMADIN  Take 2.5 mg by mouth daily.        Outstanding Labs/Studies  Followup with PCP regarding PT/INR  Duration of Discharge Encounter   Greater than 30 minutes including physician time.  Ramond Dial PA-C Pager: [phone] 09/24/2014, 11:03  AM

## 2014-09-24 NOTE — Telephone Encounter (Signed)
New message       Pt c/o medication issue:  1. Name of Medication:  prednisone 2. How are you currently taking this medication (dosage and times per day)?  3. Are you having a reaction (difficulty breathing--STAT)?   4. What is your medication issue?  Pres was written by Azalee CourseHao Meng for prednisone with no quantity

## 2014-09-24 NOTE — Progress Notes (Signed)
Inpatient Diabetes Program Recommendations  AACE/ADA: New Consensus Statement on Inpatient Glycemic Control (2013)  Target Ranges:  Prepandial:   less than 140 mg/dL      Peak postprandial:   less than 180 mg/dL (1-2 hours)      Critically ill patients:  140 - 180 mg/dL   Inpatient Diabetes Program Recommendations HgbA1C: Noted A1C of 7.0%. No hx of dm noted nor hx of home meds or restricted diet. Pt may want to check with PCP regarding OP educatin for meal plans for diabetes control and renal restrictions.  Thank you Lenor CoffinAnn Kailoni Vahle, RN, MSN, CDE  Diabetes Inpatient Program Office: [phone] Pager: [phone] 8:00 am to 5:00 pm

## 2014-09-24 NOTE — Telephone Encounter (Signed)
Spoke with Azalee CourseHao Meng, PA-C at hospital.  States prednisone order was for 40 mg to take tomorrow morning - once, before breakfast. Informed Amy with [facility] and sent updated rx in.

## 2014-09-25 ENCOUNTER — Telehealth: Payer: Self-pay | Admitting: Cardiovascular Disease

## 2014-09-25 NOTE — Telephone Encounter (Signed)
Needs a TOC phone call  °

## 2014-09-25 NOTE — Telephone Encounter (Signed)
Called, no answer. Goes to VM box that is full.

## 2014-09-26 ENCOUNTER — Encounter: Payer: Self-pay | Admitting: Internal Medicine

## 2014-09-26 ENCOUNTER — Ambulatory Visit (INDEPENDENT_AMBULATORY_CARE_PROVIDER_SITE_OTHER): Payer: Medicare Other | Admitting: Internal Medicine

## 2014-09-26 ENCOUNTER — Other Ambulatory Visit: Payer: Self-pay | Admitting: Internal Medicine

## 2014-09-26 VITALS — BP 158/94 | HR 60 | Ht 63.0 in | Wt 259.0 lb

## 2014-09-26 DIAGNOSIS — I442 Atrioventricular block, complete: Secondary | ICD-10-CM | POA: Diagnosis not present

## 2014-09-26 DIAGNOSIS — Z45018 Encounter for adjustment and management of other part of cardiac pacemaker: Secondary | ICD-10-CM

## 2014-09-26 LAB — CUP PACEART INCLINIC DEVICE CHECK
Battery Remaining Longevity: 101 mo
Date Time Interrogation Session: 20160505131943
Lead Channel Impedance Value: 566 Ohm
Lead Channel Pacing Threshold Pulse Width: 0.4 ms
Lead Channel Sensing Intrinsic Amplitude: 11.2 mV
MDC IDC MSMT BATTERY IMPEDANCE: 454 Ohm
MDC IDC MSMT BATTERY VOLTAGE: 2.78 V
MDC IDC MSMT LEADCHNL RA IMPEDANCE VALUE: 0 Ohm
MDC IDC MSMT LEADCHNL RV PACING THRESHOLD AMPLITUDE: 0.75 V
MDC IDC SET LEADCHNL RV PACING AMPLITUDE: 2.5 V
MDC IDC SET LEADCHNL RV PACING PULSEWIDTH: 0.4 ms
MDC IDC SET LEADCHNL RV SENSING SENSITIVITY: 4 mV
MDC IDC STAT BRADY RV PERCENT PACED: 0 %

## 2014-09-26 NOTE — Patient Instructions (Addendum)
Medication Instructions:  Your physician recommends that you continue on your current medications as directed. Please refer to the Current Medication list given to you today.  Labwork: Handwritten prescription given to you today to obtain blood cultures  Testing/Procedures: Your physician has requested that you have a TEE. During a TEE, sound waves are used to create images of your heart. It provides your doctor with information about the size and shape of your heart and how well your heart's chambers and valves are working. In this test, a transducer is attached to the end of a flexible tube that's guided down your throat and into your esophagus (the tube leading from you mouth to your stomach) to get a more detailed image of your heart. You are not awake for the procedure. For further information please visit [email or web address].  Grove Defina, RN will call you to arrange this procedure   Follow-Up: Your physician recommends that you schedule a follow-up appointment in: 3 months with device clinic.   Your physician wants you to follow-up in: 1 year with Dr. Graciela HusbandsKlein.  You will receive a reminder letter in the mail two months in advance. If you don't receive a letter, please call our office to schedule the follow-up appointment.   Thank you for choosing [facility]!!

## 2014-09-26 NOTE — Telephone Encounter (Signed)
Checked chart, pt arrived for today's appt.

## 2014-09-26 NOTE — Progress Notes (Signed)
    Patient Care Team: Robert E Pryor, MD as PCP - General (Internal Medicine)   HPI  Kenneth [name] is a 74 y.o. male Seen in followup for CHB s/p PPM   This was initially implanted about 25 years ago underwent generator replacement most recently 2013.  He was recently hospitalized for shortness of breath attributed to acute diastolic heart failure. He was improved with diuresis. This was maintained at discharge. Outside records were reviewed   Also of note, however, with the descriptions of 4 or 5 events associated with fevers chills and right murmurs. These date back to September. No comments on these have been made in the records that I was able to find. No blood cultures have been drawn.  Past Medical History  Diagnosis Date  . Syncope   . AV block   . Overweight(278.02)   . Hyperlipemia   . Pacemaker   . CAD (coronary artery disease)   . HTN (hypertension)   . Osteoarthritis   . GERD (gastroesophageal reflux disease)   . BPH (benign prostatic hyperplasia)   . OSA (obstructive sleep apnea)   . Thrombocytopenia   . Type 2 diabetes mellitus   . COPD (chronic obstructive pulmonary disease)   . Renal impairment   . Chronic anxiety   . Obesity   . Hypoxemia     Past Surgical History  Procedure Laterality Date  . Left eye removal  1957  . Pacemaker insertion  2003    Medtronic  . Permanent pacemaker generator change N/A 06/23/2011    Procedure: PERMANENT PACEMAKER GENERATOR CHANGE;  Surgeon: Steven C Klein, MD;  Location: [facility] CATH LAB;  Service: Cardiovascular;  Laterality: N/A;    Current Outpatient Prescriptions  Medication Sig Dispense Refill  . atenolol (TENORMIN) 100 MG tablet Take 100 mg by mouth daily.    . furosemide (LASIX) 20 MG tablet Take 40 mg by mouth every morning.    . ibuprofen (ADVIL,MOTRIN) 200 MG tablet Take 400 mg by mouth every 6 (six) hours as needed. For pain    . losartan (COZAAR) 50 MG tablet Take 1 tablet by mouth daily.    . omeprazole  (PRILOSEC) 20 MG capsule Take 20 mg by mouth daily.    . predniSONE (DELTASONE) 20 MG tablet Take 2 tablets (40 mg total) by mouth tomorrow morning before breakfast. 2 tablet 0  . ranitidine (ZANTAC) 150 MG tablet Take 150 mg by mouth 2 (two) times daily.  0  . traMADol (ULTRAM) 50 MG tablet Take 50 mg by mouth as needed. 1 tab by mouth every 6 hrs as needed  0  . warfarin (COUMADIN) 2.5 MG tablet Take 2.5 mg by mouth daily.     No current facility-administered medications for this visit.    Allergies  Allergen Reactions  . Lovastatin Other (See Comments)    myalgias  . Lisinopril     itching    Review of Systems negative except from HPI and PMH  Physical Exam BP 158/94 mmHg  Pulse 60  Ht 5' 3" (1.6 m)  Wt 259 lb (117.482 kg)  BMI 45.89 kg/m2 Well developed and Morbidly obese  in no acute distress HENT normal E scleral and icterus clear Neck Supple JVP 8-10; carotids brisk and full Clear to ausculation Device pocket well healed; without hematoma or erythema.  There is no tethering   egular rate and rhythm, no murmurs gallops or rub Soft with active bowel sounds No clubbing cyanosis  tr Edema Alert   and oriented, grossly normal motor and sensory function Skin Warm and Dry  ECG  Sinus at 71 17/[birth year]39  Assessment and  Plan  Intermittent complete heart block 0.6% vpacing at 50 bpm  Pacemaker-Medtronic The patient's device was interrogated.  The information was reviewed. No changes were made in the programming.    Diastolic heart failure  Morbid obesity  Chills and rigors  Device function is normal; the device is never used.  He remains somewhat volume overloaded. We will continue him on his current dose of diuretics.  Renal function was checked yesterday and was stable.   I am also concerned about this history of chills and rigors in the context of a 74 year old pacemaker and whether this could represent lead endocarditis. The pocket does not appear infected. We  will obtain blood cultures today and arrange for TEE.  He is encouraged to work on weight loss.

## 2014-09-27 ENCOUNTER — Telehealth: Payer: Self-pay | Admitting: *Deleted

## 2014-09-27 DIAGNOSIS — I5032 Chronic diastolic (congestive) heart failure: Secondary | ICD-10-CM | POA: Insufficient documentation

## 2014-09-27 NOTE — Telephone Encounter (Signed)
[facility] Pre procedure labs will need to be done at hospital prior to procedure as patient lives in [place]. Reviewed instructions with patient's wife, whom verbalized understanding.  You are scheduled for a cardioversion on 10/03/14 at 12:00 pm with Dr. [facility] or associates. [facility]00 am.  Enter through the [facility] Tower Entrance A Do not have any food or drink after midnight on 10/02/14.  You may take your medicines with a sip of water on the day of your procedure.  You will need someone to drive you home following your procedure.

## 2014-10-02 ENCOUNTER — Encounter: Payer: Self-pay | Admitting: Internal Medicine

## 2014-10-02 LAB — CULTURE, BLOOD (SINGLE): Organism ID, Bacteria: NO GROWTH

## 2014-10-03 ENCOUNTER — Encounter (HOSPITAL_COMMUNITY)
Admission: RE | Disposition: A | Discharge status: Home / Self Care | Payer: Self-pay | Source: Ambulatory Visit | Attending: Cardiology

## 2014-10-03 ENCOUNTER — Ambulatory Visit (HOSPITAL_COMMUNITY)
Admission: RE | Admit: 2014-10-03 | Discharge: 2014-10-03 | Disposition: A | Discharge status: Home / Self Care | Payer: Medicare Other | Source: Ambulatory Visit | Attending: Cardiology | Admitting: Cardiology

## 2014-10-03 ENCOUNTER — Encounter (HOSPITAL_COMMUNITY): Payer: Self-pay | Admitting: *Deleted

## 2014-10-03 DIAGNOSIS — M1990 Unspecified osteoarthritis, unspecified site: Secondary | ICD-10-CM | POA: Diagnosis not present

## 2014-10-03 DIAGNOSIS — I10 Essential (primary) hypertension: Secondary | ICD-10-CM | POA: Diagnosis not present

## 2014-10-03 DIAGNOSIS — Z950 Presence of cardiac pacemaker: Secondary | ICD-10-CM | POA: Diagnosis not present

## 2014-10-03 DIAGNOSIS — Z6841 Body Mass Index (BMI) 40.0 and over, adult: Secondary | ICD-10-CM | POA: Insufficient documentation

## 2014-10-03 DIAGNOSIS — E785 Hyperlipidemia, unspecified: Secondary | ICD-10-CM | POA: Diagnosis not present

## 2014-10-03 DIAGNOSIS — E119 Type 2 diabetes mellitus without complications: Secondary | ICD-10-CM | POA: Insufficient documentation

## 2014-10-03 DIAGNOSIS — N400 Enlarged prostate without lower urinary tract symptoms: Secondary | ICD-10-CM | POA: Insufficient documentation

## 2014-10-03 DIAGNOSIS — J449 Chronic obstructive pulmonary disease, unspecified: Secondary | ICD-10-CM | POA: Diagnosis not present

## 2014-10-03 DIAGNOSIS — D696 Thrombocytopenia, unspecified: Secondary | ICD-10-CM | POA: Insufficient documentation

## 2014-10-03 DIAGNOSIS — R0602 Shortness of breath: Secondary | ICD-10-CM | POA: Diagnosis present

## 2014-10-03 DIAGNOSIS — Z888 Allergy status to other drugs, medicaments and biological substances status: Secondary | ICD-10-CM | POA: Insufficient documentation

## 2014-10-03 DIAGNOSIS — I5030 Unspecified diastolic (congestive) heart failure: Secondary | ICD-10-CM | POA: Diagnosis not present

## 2014-10-03 DIAGNOSIS — R55 Syncope and collapse: Secondary | ICD-10-CM

## 2014-10-03 DIAGNOSIS — F419 Anxiety disorder, unspecified: Secondary | ICD-10-CM | POA: Insufficient documentation

## 2014-10-03 DIAGNOSIS — I442 Atrioventricular block, complete: Secondary | ICD-10-CM | POA: Diagnosis not present

## 2014-10-03 DIAGNOSIS — G4733 Obstructive sleep apnea (adult) (pediatric): Secondary | ICD-10-CM | POA: Diagnosis not present

## 2014-10-03 DIAGNOSIS — Z7901 Long term (current) use of anticoagulants: Secondary | ICD-10-CM | POA: Insufficient documentation

## 2014-10-03 DIAGNOSIS — I2510 Atherosclerotic heart disease of native coronary artery without angina pectoris: Secondary | ICD-10-CM | POA: Diagnosis not present

## 2014-10-03 DIAGNOSIS — K219 Gastro-esophageal reflux disease without esophagitis: Secondary | ICD-10-CM | POA: Insufficient documentation

## 2014-10-03 DIAGNOSIS — Q211 Atrial septal defect: Secondary | ICD-10-CM | POA: Diagnosis not present

## 2014-10-03 HISTORY — PX: TEE WITHOUT CARDIOVERSION: SHX5443

## 2014-10-03 SURGERY — ECHOCARDIOGRAM, TRANSESOPHAGEAL
Anesthesia: Moderate Sedation

## 2014-10-03 MED ORDER — BUTAMBEN-TETRACAINE-BENZOCAINE 2-2-14 % EX AERO
INHALATION_SPRAY | CUTANEOUS | Status: DC | PRN
  Administered 2014-10-03: 2 via TOPICAL

## 2014-10-03 MED ORDER — FENTANYL CITRATE (PF) 100 MCG/2ML IJ SOLN
INTRAMUSCULAR | Status: DC | PRN
  Administered 2014-10-03 (×2): 25 ug via INTRAVENOUS

## 2014-10-03 MED ORDER — FENTANYL CITRATE (PF) 100 MCG/2ML IJ SOLN
INTRAMUSCULAR | Status: AC
  Filled 2014-10-03: qty 2

## 2014-10-03 MED ORDER — DIPHENHYDRAMINE HCL 50 MG/ML IJ SOLN
INTRAMUSCULAR | Status: AC
Start: 2014-10-03 — End: 2014-10-03
  Filled 2014-10-03: qty 1

## 2014-10-03 MED ORDER — MIDAZOLAM HCL 10 MG/2ML IJ SOLN
INTRAMUSCULAR | Status: DC | PRN
  Administered 2014-10-03: 1 mg via INTRAVENOUS
  Administered 2014-10-03: 2 mg via INTRAVENOUS

## 2014-10-03 MED ORDER — SODIUM CHLORIDE 0.9 % IV SOLN
INTRAVENOUS | Status: DC
  Administered 2014-10-03: 500 mL via INTRAVENOUS

## 2014-10-03 MED ORDER — MIDAZOLAM HCL 5 MG/ML IJ SOLN
INTRAMUSCULAR | Status: AC
  Filled 2014-10-03: qty 2

## 2014-10-03 NOTE — Progress Notes (Signed)
  Echocardiogram Echocardiogram Transesophageal has been performed.  Delcie RochENNINGTON, Kenijah Benningfield 10/03/2014, 11:52 AM

## 2014-10-03 NOTE — Interval H&P Note (Signed)
History and Physical Interval Note:  10/03/2014 11:27 AM  Kenneth [name]  has presented today for surgery, with the diagnosis of rule out lead infection  The various methods of treatment have been discussed with the patient and family. After consideration of risks, benefits and other options for treatment, the patient has consented to  Procedure(s): TRANSESOPHAGEAL ECHOCARDIOGRAM (TEE) (N/A) as a surgical intervention .  The patient's history has been reviewed, patient examined, no change in status, stable for surgery.  I have reviewed the patient's chart and labs.  Questions were answered to the patient's satisfaction.     Charlton HawsPeter Makell Cyr

## 2014-10-03 NOTE — Discharge Instructions (Addendum)
F/U with Dr Ladona Ridgelaylor to discuss lead extractionTransesophageal Echocardiogram Transesophageal echocardiography (TEE) is a picture test of your heart using sound waves. The pictures taken can give very detailed pictures of your heart. This can help your doctor see if there are problems with your heart. TEE can check:  If your heart has blood clots in it.  How well your heart valves are working.  If you have an infection on the inside of your heart.  Some of the major arteries of your heart.  If your heart valve is working after a Psychologist, forensicrepair.  Your heart before a procedure that uses a shock to your heart to get the rhythm back to normal. BEFORE THE PROCEDURE  Do not eat or drink for 6 hours before the procedure or as told by your doctor.  Make plans to have someone drive you home after the procedure. Do not drive yourself home.  An IV tube will be put in your arm. PROCEDURE  You will be given a medicine to help you relax (sedative). It will be given through the IV tube.  A numbing medicine will be sprayed or gargled in the back of your throat to help numb it.  The tip of the probe is placed into the back of your mouth. You will be asked to swallow. This helps to pass the probe into your esophagus.  Once the tip of the probe is in the right place, your doctor can take pictures of your heart.  You may feel pressure at the back of your throat. AFTER THE PROCEDURE  You will be taken to a recovery area so the sedative can wear off.  Your throat may be sore and scratchy. This will go away slowly over time.  You will go home when you are fully awake and able to swallow liquids.  You should have someone stay with you for the next 24 hours.  Do not drive or operate machinery for the next 24 hours. Document Released: 03/07/2009 Document Revised: 05/15/2013 Document Reviewed: 11/09/2012 [facility] Patient Information 2015 [employer] [place]. This information is not intended to replace advice  given to you by your health care provider. Make sure you discuss any questions you have with your health care provider.

## 2014-10-03 NOTE — H&P (View-Only) (Signed)
Patient Care Team: Jerene Pitchobert E Pryor, MD as PCP - General (Internal Medicine)   HPI  Kenneth [name] is a 74 y.o. male Seen in followup for CHB s/p PPM   This was initially implanted about 25 years ago underwent generator replacement most recently 2013.  He was recently hospitalized for shortness of breath attributed to acute diastolic heart failure. He was improved with diuresis. This was maintained at discharge. Outside records were reviewed   Also of note, however, with the descriptions of 4 or 5 events associated with fevers chills and right murmurs. These date back to September. No comments on these have been made in the records that I was able to find. No blood cultures have been drawn.  Past Medical History  Diagnosis Date  . Syncope   . AV block   . Overweight(278.02)   . Hyperlipemia   . Pacemaker   . CAD (coronary artery disease)   . HTN (hypertension)   . Osteoarthritis   . GERD (gastroesophageal reflux disease)   . BPH (benign prostatic hyperplasia)   . OSA (obstructive sleep apnea)   . Thrombocytopenia   . Type 2 diabetes mellitus   . COPD (chronic obstructive pulmonary disease)   . Renal impairment   . Chronic anxiety   . Obesity   . Hypoxemia     Past Surgical History  Procedure Laterality Date  . Left eye removal  1957  . Pacemaker insertion  2003    Medtronic  . Permanent pacemaker generator change N/A 06/23/2011    Procedure: PERMANENT PACEMAKER GENERATOR CHANGE;  Surgeon: Duke SalviaSteven C Jett Kulzer, MD;  [facility];  Service: Cardiovascular;  Laterality: N/A;    Current Outpatient Prescriptions  Medication Sig Dispense Refill  . atenolol (TENORMIN) 100 MG tablet Take 100 mg by mouth daily.    . furosemide (LASIX) 20 MG tablet Take 40 mg by mouth every morning.    Marland Kitchen. ibuprofen (ADVIL,MOTRIN) 200 MG tablet Take 400 mg by mouth every 6 (six) hours as needed. For pain    . losartan (COZAAR) 50 MG tablet Take 1 tablet by mouth daily.    Marland Kitchen. omeprazole  (PRILOSEC) 20 MG capsule Take 20 mg by mouth daily.    . predniSONE (DELTASONE) 20 MG tablet Take 2 tablets (40 mg total) by mouth tomorrow morning before breakfast. 2 tablet 0  . ranitidine (ZANTAC) 150 MG tablet Take 150 mg by mouth 2 (two) times daily.  0  . traMADol (ULTRAM) 50 MG tablet Take 50 mg by mouth as needed. 1 tab by mouth every 6 hrs as needed  0  . warfarin (COUMADIN) 2.5 MG tablet Take 2.5 mg by mouth daily.     No current facility-administered medications for this visit.    Allergies  Allergen Reactions  . Lovastatin Other (See Comments)    myalgias  . Lisinopril     itching    Review of Systems negative except from HPI and PMH  Physical Exam BP 158/94 mmHg  Pulse 60  Ht 5\' 3"  (1.6 m)  Wt 259 lb (117.482 kg)  BMI 45.89 kg/m2 Well developed and Morbidly obese  in no acute distress HENT normal E scleral and icterus clear Neck Supple JVP 8-10; carotids brisk and full Clear to ausculation Device pocket well healed; without hematoma or erythema.  There is no tethering   egular rate and rhythm, no murmurs gallops or rub Soft with active bowel sounds No clubbing cyanosis  tr Edema Alert  and oriented, grossly normal motor and sensory function Skin Warm and Dry  ECG  Sinus at 71 17/09/39  Assessment and  Plan  Intermittent complete heart block 0.6% vpacing at 50 bpm  Pacemaker-Medtronic The patient's device was interrogated.  The information was reviewed. No changes were made in the programming.    Diastolic heart failure  Morbid obesity  Chills and rigors  Device function is normal; the device is never used.  He remains somewhat volume overloaded. We will continue him on his current dose of diuretics.  Renal function was checked yesterday and was stable.   I am also concerned about this history of chills and rigors in the context of a 74 year old pacemaker and whether this could represent lead endocarditis. The pocket does not appear infected. We  will obtain blood cultures today and arrange for TEE.  He is encouraged to work on weight loss.

## 2014-10-03 NOTE — CV Procedure (Signed)
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 Given size Of vegetation may need to be open procedure especially with leads being [id number] years old  Charlton Hawseter Elleah Hemsley

## 2014-10-04 ENCOUNTER — Encounter (HOSPITAL_COMMUNITY): Payer: Self-pay | Admitting: Cardiovascular Disease

## 2014-10-09 ENCOUNTER — Telehealth: Payer: Self-pay | Admitting: Internal Medicine

## 2014-10-09 ENCOUNTER — Encounter: Payer: Self-pay | Admitting: Internal Medicine

## 2014-10-09 ENCOUNTER — Ambulatory Visit (INDEPENDENT_AMBULATORY_CARE_PROVIDER_SITE_OTHER): Payer: Medicare Other | Admitting: Internal Medicine

## 2014-10-09 VITALS — BP 98/68 | HR 68 | Ht 63.5 in | Wt 252.0 lb

## 2014-10-09 DIAGNOSIS — T827XXA Infection and inflammatory reaction due to other cardiac and vascular devices, implants and grafts, initial encounter: Secondary | ICD-10-CM | POA: Diagnosis not present

## 2014-10-09 DIAGNOSIS — Z950 Presence of cardiac pacemaker: Secondary | ICD-10-CM

## 2014-10-09 DIAGNOSIS — E663 Overweight: Secondary | ICD-10-CM

## 2014-10-09 DIAGNOSIS — I2510 Atherosclerotic heart disease of native coronary artery without angina pectoris: Secondary | ICD-10-CM

## 2014-10-09 NOTE — Assessment & Plan Note (Addendum)
The patient has a large vegetation on his pacing lead seen in the right atrium. We discussed percutaneous removal of the leads versus open surgical procedure. We weighed the pros and cons of both approaches. The patient would like to be referred to a surgeon to consider removal of the vegetation and total. I think this is a very reasonable approach. The patient is not pacemaker dependent. The decision about re-implantation of a new pacemaker will be made dependent upon his heart rate during general anesthesia, as well as in the postoperative course.

## 2014-10-09 NOTE — Assessment & Plan Note (Signed)
The patient has no anginal symptoms. He will continue his current medications.

## 2014-10-09 NOTE — Progress Notes (Signed)
HPI 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 He is a pleasant 74 yo man with a h/o HTN, COPD, obesity, and dyslipidemia. He underwent pacemaker insertion initially approximately 25 years ago. In the interim he has had a pacemaker generator change out twice, most recently in 2013. The patient is not pacemaker dependent. Approximately 10 months ago, he began noticing chills and fever. These episodes were quite episodic occurring once or twice a month and lasting an hour or 2 in duration. Most recently, he underwent transesophageal echo which demonstrated a large vegetation, measuring 3.2 x 1.6 cm on the pacing lead. He is referred today to consider extraction. He has not had syncope. He otherwise feels well except that he is weak. His wife notes that he has actually gained weight. His appetite is still good but he is sedentary. He was begun on anticoagulation after being diagnosed with a pulmonary embolism several days ago, and this is almost certainly secondary to his endocarditis. Allergies  Allergen Reactions  . Lovastatin Other (See Comments)    myalgias  . Lisinopril     itching     Current Outpatient Prescriptions  Medication Sig Dispense Refill  . atenolol (TENORMIN) 100 MG tablet Take 100 mg by mouth daily.    . furosemide (LASIX) 20 MG tablet Take 40 mg by mouth every morning.    Marland Kitchen. ibuprofen (ADVIL,MOTRIN) 200 MG tablet Take 400 mg by mouth every 6 (six) hours as needed. For pain    . losartan (COZAAR) 50 MG tablet Take 1 tablet by mouth daily.    . ranitidine (ZANTAC) 150 MG tablet Take 150 mg by mouth 2 (two) times daily.  0  . traMADol (ULTRAM) 50 MG tablet Take 50 mg by mouth as needed. 1 tab by mouth every 6 hrs as needed  0  . warfarin (COUMADIN) 2.5 MG tablet Take 2.5 mg by mouth daily.     No current facility-administered medications for this visit.     Past Medical History  Diagnosis Date  . Syncope   . AV block   . Overweight(278.02)     . Hyperlipemia   . Pacemaker   . CAD (coronary artery disease)   . HTN (hypertension)   . Osteoarthritis   . GERD (gastroesophageal reflux disease)   . BPH (benign prostatic hyperplasia)   . OSA (obstructive sleep apnea)   . Thrombocytopenia   . Type 2 diabetes mellitus   . COPD (chronic obstructive pulmonary disease)   . Renal impairment   . Chronic anxiety   . Obesity   . Hypoxemia     ROS:   All systems reviewed and negative except as noted in the HPI.   Past Surgical History  Procedure Laterality Date  . Left eye removal  1957  . Pacemaker insertion  2003    Medtronic  . Permanent pacemaker generator change N/A 06/23/2011    Procedure: PERMANENT PACEMAKER GENERATOR CHANGE;  Surgeon: Duke SalviaSteven C Klein, MD;  [facility];  Service: Cardiovascular;  Laterality: N/A;  . Tee without cardioversion N/A 10/03/2014    Procedure: TRANSESOPHAGEAL ECHOCARDIOGRAM (TEE);  Surgeon: Wendall StadePeter C Nishan, MD;  [facility];  Service: Cardiovascular;  Laterality: N/A;     Family History  Problem Relation Age of Onset  . Heart disease Mother      History   Social History  . Marital Status: Married    Spouse Name: N/A  . Number of Children: N/A  .  Years of Education: N/A   Occupational History  . retired    Social History Main Topics  . Smoking status: Former Smoker    Types: Cigarettes    Quit date: 05/25/1991  . Smokeless tobacco: Not on file  . Alcohol Use: No  . Drug Use: No  . Sexual Activity: Not on file   Other Topics Concern  . Not on file   Social History Narrative     BP 98/68 mmHg  Pulse 68  Ht 5' 3.5" (1.613 m)  Wt 252 lb (114.306 kg)  BMI 43.93 kg/m2  Physical Exam:  Stable appearing middle-aged man, obese, but in NAD HEENT: Unremarkable Neck:  No JVD, no thyromegally Back:  No CVA tenderness Lungs:  Clear, with no wheezes or rhonchi present. Well-healed pacemaker incision HEART:  Regular rate rhythm, no murmurs, no rubs, no  clicks Abd:  soft, obese, positive bowel sounds, no organomegally, no rebound, no guarding Ext:  2 plus pulses, trace peripheral edema, no cyanosis, no clubbing Skin:  No rashes no nodules Neuro:  CN II through XII intact, motor grossly intact   DEVICE  His device was not interrogated today.  Assess/Plan:

## 2014-10-09 NOTE — Assessment & Plan Note (Signed)
The patient is obese and this will make healing more difficult. He is encouraged to lose weight.

## 2014-10-09 NOTE — Telephone Encounter (Signed)
New Message   Patients wife is calling b/c she needs to know what the consultation for his procedure. Please give patient a call.

## 2014-10-09 NOTE — Telephone Encounter (Signed)
lmtcb

## 2014-10-09 NOTE — Patient Instructions (Signed)
Medication Instructions:  Your physician recommends that you continue on your current medications as directed. Please refer to the Current Medication list given to you today.  Labwork: None ordered  Testing/Procedures: None ordered  Follow-Up: Heart surgeon's office will call you to arrange an office visit with them this afternoon or tomorrow - to discuss device extraction  Thank you for choosing [facility]!!

## 2014-10-10 ENCOUNTER — Inpatient Hospital Stay (HOSPITAL_COMMUNITY): Payer: Medicare Other

## 2014-10-10 ENCOUNTER — Encounter (HOSPITAL_COMMUNITY): Payer: Self-pay | Admitting: *Deleted

## 2014-10-10 ENCOUNTER — Encounter: Payer: Self-pay | Admitting: Cardiothoracic Surgery

## 2014-10-10 ENCOUNTER — Institutional Professional Consult (permissible substitution) (INDEPENDENT_AMBULATORY_CARE_PROVIDER_SITE_OTHER): Payer: Medicare Other | Admitting: Cardiothoracic Surgery

## 2014-10-10 ENCOUNTER — Inpatient Hospital Stay (HOSPITAL_COMMUNITY)
Admission: AD | Admit: 2014-10-10 | Discharge: 2014-10-22 | DRG: 228 | Disposition: A | Discharge status: Home Health | Payer: Medicare Other | Source: Ambulatory Visit | Attending: Cardiothoracic Surgery | Admitting: Cardiothoracic Surgery

## 2014-10-10 VITALS — BP 112/79 | HR 71 | Temp 97.9°F | Resp 21 | Ht 63.5 in | Wt 252.0 lb

## 2014-10-10 DIAGNOSIS — M1990 Unspecified osteoarthritis, unspecified site: Secondary | ICD-10-CM | POA: Diagnosis present

## 2014-10-10 DIAGNOSIS — G4733 Obstructive sleep apnea (adult) (pediatric): Secondary | ICD-10-CM | POA: Diagnosis present

## 2014-10-10 DIAGNOSIS — I2510 Atherosclerotic heart disease of native coronary artery without angina pectoris: Secondary | ICD-10-CM | POA: Diagnosis present

## 2014-10-10 DIAGNOSIS — E669 Obesity, unspecified: Secondary | ICD-10-CM | POA: Diagnosis present

## 2014-10-10 DIAGNOSIS — J939 Pneumothorax, unspecified: Secondary | ICD-10-CM

## 2014-10-10 DIAGNOSIS — E119 Type 2 diabetes mellitus without complications: Secondary | ICD-10-CM | POA: Diagnosis present

## 2014-10-10 DIAGNOSIS — Y831 Surgical operation with implant of artificial internal device as the cause of abnormal reaction of the patient, or of later complication, without mention of misadventure at the time of the procedure: Secondary | ICD-10-CM | POA: Diagnosis present

## 2014-10-10 DIAGNOSIS — I129 Hypertensive chronic kidney disease with stage 1 through stage 4 chronic kidney disease, or unspecified chronic kidney disease: Secondary | ICD-10-CM | POA: Diagnosis present

## 2014-10-10 DIAGNOSIS — D62 Acute posthemorrhagic anemia: Secondary | ICD-10-CM | POA: Diagnosis not present

## 2014-10-10 DIAGNOSIS — R0602 Shortness of breath: Secondary | ICD-10-CM

## 2014-10-10 DIAGNOSIS — Z86711 Personal history of pulmonary embolism: Secondary | ICD-10-CM

## 2014-10-10 DIAGNOSIS — D696 Thrombocytopenia, unspecified: Secondary | ICD-10-CM | POA: Diagnosis not present

## 2014-10-10 DIAGNOSIS — R911 Solitary pulmonary nodule: Secondary | ICD-10-CM | POA: Diagnosis present

## 2014-10-10 DIAGNOSIS — D689 Coagulation defect, unspecified: Secondary | ICD-10-CM | POA: Diagnosis not present

## 2014-10-10 DIAGNOSIS — Z87891 Personal history of nicotine dependence: Secondary | ICD-10-CM

## 2014-10-10 DIAGNOSIS — I330 Acute and subacute infective endocarditis: Secondary | ICD-10-CM | POA: Diagnosis present

## 2014-10-10 DIAGNOSIS — F419 Anxiety disorder, unspecified: Secondary | ICD-10-CM | POA: Diagnosis present

## 2014-10-10 DIAGNOSIS — E785 Hyperlipidemia, unspecified: Secondary | ICD-10-CM | POA: Diagnosis present

## 2014-10-10 DIAGNOSIS — N189 Chronic kidney disease, unspecified: Secondary | ICD-10-CM | POA: Diagnosis not present

## 2014-10-10 DIAGNOSIS — Z9001 Acquired absence of eye: Secondary | ICD-10-CM | POA: Diagnosis present

## 2014-10-10 DIAGNOSIS — K219 Gastro-esophageal reflux disease without esophagitis: Secondary | ICD-10-CM | POA: Diagnosis present

## 2014-10-10 DIAGNOSIS — I38 Endocarditis, valve unspecified: Secondary | ICD-10-CM

## 2014-10-10 DIAGNOSIS — R0902 Hypoxemia: Secondary | ICD-10-CM | POA: Diagnosis present

## 2014-10-10 DIAGNOSIS — J449 Chronic obstructive pulmonary disease, unspecified: Secondary | ICD-10-CM | POA: Diagnosis present

## 2014-10-10 DIAGNOSIS — Z7901 Long term (current) use of anticoagulants: Secondary | ICD-10-CM

## 2014-10-10 DIAGNOSIS — Z09 Encounter for follow-up examination after completed treatment for conditions other than malignant neoplasm: Secondary | ICD-10-CM

## 2014-10-10 DIAGNOSIS — N183 Chronic kidney disease, stage 3 unspecified: Secondary | ICD-10-CM | POA: Diagnosis present

## 2014-10-10 DIAGNOSIS — N400 Enlarged prostate without lower urinary tract symptoms: Secondary | ICD-10-CM | POA: Diagnosis present

## 2014-10-10 DIAGNOSIS — I339 Acute and subacute endocarditis, unspecified: Secondary | ICD-10-CM | POA: Diagnosis not present

## 2014-10-10 DIAGNOSIS — I10 Essential (primary) hypertension: Secondary | ICD-10-CM | POA: Diagnosis not present

## 2014-10-10 DIAGNOSIS — R197 Diarrhea, unspecified: Secondary | ICD-10-CM | POA: Diagnosis not present

## 2014-10-10 DIAGNOSIS — E663 Overweight: Secondary | ICD-10-CM | POA: Diagnosis present

## 2014-10-10 DIAGNOSIS — I252 Old myocardial infarction: Secondary | ICD-10-CM

## 2014-10-10 DIAGNOSIS — Z950 Presence of cardiac pacemaker: Secondary | ICD-10-CM | POA: Diagnosis not present

## 2014-10-10 DIAGNOSIS — Z6841 Body Mass Index (BMI) 40.0 and over, adult: Secondary | ICD-10-CM | POA: Diagnosis not present

## 2014-10-10 DIAGNOSIS — T827XXA Infection and inflammatory reaction due to other cardiac and vascular devices, implants and grafts, initial encounter: Secondary | ICD-10-CM | POA: Diagnosis present

## 2014-10-10 DIAGNOSIS — T827XXS Infection and inflammatory reaction due to other cardiac and vascular devices, implants and grafts, sequela: Secondary | ICD-10-CM

## 2014-10-10 DIAGNOSIS — B9689 Other specified bacterial agents as the cause of diseases classified elsewhere: Secondary | ICD-10-CM | POA: Diagnosis not present

## 2014-10-10 DIAGNOSIS — Q211 Atrial septal defect: Secondary | ICD-10-CM | POA: Diagnosis not present

## 2014-10-10 DIAGNOSIS — T827XXD Infection and inflammatory reaction due to other cardiac and vascular devices, implants and grafts, subsequent encounter: Secondary | ICD-10-CM | POA: Diagnosis not present

## 2014-10-10 DIAGNOSIS — Z01818 Encounter for other preprocedural examination: Secondary | ICD-10-CM

## 2014-10-10 DIAGNOSIS — T82897A Other specified complication of cardiac prosthetic devices, implants and grafts, initial encounter: Secondary | ICD-10-CM | POA: Diagnosis not present

## 2014-10-10 DIAGNOSIS — Z9689 Presence of other specified functional implants: Secondary | ICD-10-CM

## 2014-10-10 LAB — CBC
HEMATOCRIT: 44.7 % (ref 39.0–52.0)
HEMOGLOBIN: 15.2 g/dL (ref 13.0–17.0)
MCH: 28.7 pg (ref 26.0–34.0)
MCHC: 34 g/dL (ref 30.0–36.0)
MCV: 84.3 fL (ref 78.0–100.0)
Platelets: 128 10*3/uL — ABNORMAL LOW (ref 150–400)
RBC: 5.3 MIL/uL (ref 4.22–5.81)
RDW: 14.6 % (ref 11.5–15.5)
WBC: 9.2 10*3/uL (ref 4.0–10.5)

## 2014-10-10 LAB — PROTIME-INR
INR: 4.12 — ABNORMAL HIGH (ref 0.00–1.49)
PROTHROMBIN TIME: 38.9 s — AB (ref 11.6–15.2)

## 2014-10-10 LAB — COMPREHENSIVE METABOLIC PANEL
ALBUMIN: 3 g/dL — AB (ref 3.5–5.0)
ALK PHOS: 67 U/L (ref 38–126)
ALT: 25 U/L (ref 17–63)
AST: 25 U/L (ref 15–41)
Anion gap: 11 (ref 5–15)
BILIRUBIN TOTAL: 1 mg/dL (ref 0.3–1.2)
BUN: 19 mg/dL (ref 6–20)
CO2: 22 mmol/L (ref 22–32)
CREATININE: 1.57 mg/dL — AB (ref 0.61–1.24)
Calcium: 8.6 mg/dL — ABNORMAL LOW (ref 8.9–10.3)
Chloride: 104 mmol/L (ref 101–111)
GFR calc Af Amer: 49 mL/min — ABNORMAL LOW (ref >60)
GFR, EST NON AFRICAN AMERICAN: 42 mL/min — AB (ref >60)
Glucose, Bld: 140 mg/dL — ABNORMAL HIGH (ref 65–99)
POTASSIUM: 3.8 mmol/L (ref 3.5–5.1)
Sodium: 137 mmol/L (ref 135–145)
Total Protein: 6.7 g/dL (ref 6.5–8.1)

## 2014-10-10 MED ORDER — SODIUM CHLORIDE 0.9 % IJ SOLN: 3.0000 mL | INTRAMUSCULAR | Status: DC | PRN

## 2014-10-10 MED ORDER — FLEET ENEMA 7-19 GM/118ML RE ENEM
1.0000 | ENEMA | Freq: Once | RECTAL | Status: AC | PRN
  Filled 2014-10-10: qty 1

## 2014-10-10 MED ORDER — FAMOTIDINE 20 MG PO TABS
20.0000 mg | ORAL_TABLET | Freq: Two times a day (BID) | ORAL | Status: DC
  Administered 2014-10-10 – 2014-10-15 (×11): 20 mg via ORAL
  Filled 2014-10-10 (×13): qty 1

## 2014-10-10 MED ORDER — SODIUM CHLORIDE 0.9 % IV SOLN
250.0000 mL | INTRAVENOUS | Status: DC | PRN
  Administered 2014-10-16: 14:00:00 via INTRAVENOUS

## 2014-10-10 MED ORDER — ATENOLOL 100 MG PO TABS
100.0000 mg | ORAL_TABLET | Freq: Every day | ORAL | Status: DC
  Administered 2014-10-10 – 2014-10-15 (×6): 100 mg via ORAL
  Filled 2014-10-10 (×7): qty 1

## 2014-10-10 MED ORDER — SODIUM CHLORIDE 0.9 % IJ SOLN
3.0000 mL | Freq: Two times a day (BID) | INTRAMUSCULAR | Status: DC
  Administered 2014-10-12 – 2014-10-13 (×2): 3 mL via INTRAVENOUS

## 2014-10-10 MED ORDER — SODIUM CHLORIDE 0.9 % IJ SOLN
3.0000 mL | Freq: Two times a day (BID) | INTRAMUSCULAR | Status: DC
  Administered 2014-10-11 – 2014-10-15 (×7): 3 mL via INTRAVENOUS

## 2014-10-10 MED ORDER — ALUM & MAG HYDROXIDE-SIMETH 200-200-20 MG/5ML PO SUSP: 30.0000 mL | Freq: Four times a day (QID) | ORAL | Status: DC | PRN

## 2014-10-10 MED ORDER — TRAMADOL HCL 50 MG PO TABS: 50.0000 mg | ORAL_TABLET | Freq: Four times a day (QID) | ORAL | Status: DC | PRN

## 2014-10-10 MED ORDER — LOSARTAN POTASSIUM 50 MG PO TABS
50.0000 mg | ORAL_TABLET | Freq: Every day | ORAL | Status: DC
  Administered 2014-10-10 – 2014-10-15 (×6): 50 mg via ORAL
  Filled 2014-10-10 (×7): qty 1

## 2014-10-10 MED ORDER — ENOXAPARIN SODIUM 40 MG/0.4ML ~~LOC~~ SOLN
40.0000 mg | SUBCUTANEOUS | Status: DC
  Administered 2014-10-10: 40 mg via SUBCUTANEOUS
  Filled 2014-10-10 (×2): qty 0.4

## 2014-10-10 MED ORDER — DOCUSATE SODIUM 100 MG PO CAPS
100.0000 mg | ORAL_CAPSULE | Freq: Two times a day (BID) | ORAL | Status: DC
  Administered 2014-10-11 – 2014-10-15 (×4): 100 mg via ORAL
  Filled 2014-10-10 (×13): qty 1

## 2014-10-10 MED ORDER — FUROSEMIDE 40 MG PO TABS
40.0000 mg | ORAL_TABLET | Freq: Every day | ORAL | Status: DC
  Administered 2014-10-10 – 2014-10-15 (×6): 40 mg via ORAL
  Filled 2014-10-10 (×7): qty 1

## 2014-10-10 MED ORDER — MAGNESIUM HYDROXIDE 400 MG/5ML PO SUSP: 30.0000 mL | Freq: Every day | ORAL | Status: DC | PRN

## 2014-10-10 MED ORDER — ACETAMINOPHEN 650 MG RE SUPP: 650.0000 mg | Freq: Four times a day (QID) | RECTAL | Status: DC | PRN

## 2014-10-10 MED ORDER — BISACODYL 10 MG RE SUPP: 10.0000 mg | Freq: Every day | RECTAL | Status: DC | PRN

## 2014-10-10 MED ORDER — ACETAMINOPHEN 325 MG PO TABS
650.0000 mg | ORAL_TABLET | Freq: Four times a day (QID) | ORAL | Status: DC | PRN
Start: 2014-10-10 — End: 2014-10-16

## 2014-10-10 MED ORDER — ONDANSETRON HCL 4 MG/2ML IJ SOLN: 4.0000 mg | Freq: Four times a day (QID) | INTRAMUSCULAR | Status: DC | PRN

## 2014-10-10 MED ORDER — ONDANSETRON HCL 4 MG PO TABS: 4.0000 mg | ORAL_TABLET | Freq: Four times a day (QID) | ORAL | Status: DC | PRN

## 2014-10-10 NOTE — Telephone Encounter (Signed)
Left message to return call 

## 2014-10-10 NOTE — Progress Notes (Signed)
Dr. Ladona Ridgelaylor has planned lexiscan myoview for 10/11/14 and he will see the pt on the 20th as well.

## 2014-10-10 NOTE — Progress Notes (Signed)
Patient seen in office and admitted, see admission note

## 2014-10-10 NOTE — H&P (Signed)
[address]       [place] [id number]             [phone]        [facility] Medical Record #[id number] Date of Birth: Oct 10, 1940  Referring:Cardiology ServicePrimary Care: Jerene PitchPRYOR,ROBERT E, MD  Chief Complaint:   endocarditis, pulmonary embolism   History of Present Illness:     [age] male with a history of syncope 25 years ago resulting in a placement of a pacemaker, since that time he has had the generator revised twice the most recent 2013 by Dr. Graciela HusbandsKlein. [facility], patient's wife notes that he attempted to remove the leads were unable to and put a new lead in. Since the summer of 2015 the patient has had symptoms of chills and rigors episodically. He denies any definite known fevers. Has had episodes of shortness of breath precipitating [facility] and most recently to [facility] on 5/3. At that time the [facility] the white count 22,000 platelet count 88,000, was treated with steroids, one blood culture was positive for staph epi. On 5/5 TEE showed a 3 cm vegetation on the ventricular lead at the level of the tricuspid valve and a large PFO. Patient was discharged home and returns to the office today to discuss surgical removal. In addition the patient was [facility] patient reports a CT scan was done demonstrating a pulmonary embolus and he was started on Coumadin.  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 WM Patient is in for care transition visit following recent hospital stay. H/P and DC Summary reviewed as well as [facility] - no interval changes except as noted. [facility] -  see her note. Med list reviewed today and Med reconciliation completed w/ updates to med list as noted.  Dates of hospital stay: 5/11 - 5/12 [facility]/ F[facility], ch[facility] and was tx to his cardiol. For adm. To [facility] [place] over concern re: infected PM leads. [facility] and had mult blood cultures - has heard no results yet. Breathing at baseline. No fever, chills, or sweats now. He did not receive any abx.  He feels Ok now. Will see Cardiol. Next week to f/u on Blood cx results.  Breathing at baseline. He and Nita tell me he [facility]   Written CT report, in add'n to pulm. Emboli, reported a 1cm LLL pulm. Nodule for which 3 mos f/u CT was recommended [facility] 4[facility]  Current Activity/ Functional Status: Patient is independent with mobility/ambulation, transfers, ADL's, IADL's.   Zubrod Score: At the time of surgery this patient's most appropriate activity status/level should be described as: []     0    Normal activity, no symptoms [x]     1    Restricted in physical strenuous activity but ambulatory, able to do out light work []     2    Ambulatory and capable of self care, unable to do work activities, up and about  more than 50%  Of the time                                3    Only limited self care, in bed greater than 50% of waking hours     4    Completely disabled, no self care, confined to bed or chair     5    Moribund  Past Medical History  Diagnosis Date  . Syncope   . AV block   . Overweight(278.02)   . Hyperlipemia   . Pacemaker   . CAD (coronary artery disease)   . HTN (hypertension)   . Osteoarthritis   . GERD (gastroesophageal reflux disease)   . BPH (benign prostatic hyperplasia)   . OSA (obstructive sleep apnea)   . Thrombocytopenia   . Type 2 diabetes mellitus   . COPD (chronic obstructive pulmonary disease)   . Renal impairment   . Chronic anxiety   .  Obesity   . Hypoxemia     Past Surgical History  Procedure Laterality Date  . Left eye removal  1957  . Pacemaker insertion  2003    Medtronic  . Permanent pacemaker generator change N/A 06/23/2011    Procedure: PERMANENT PACEMAKER GENERATOR CHANGE;  Surgeon: Duke Salvia, MD;  Location: [facility] CATH LAB;  Service: Cardiovascular;  Laterality: N/A;  . Tee without cardioversion N/A 10/03/2014    Procedure: TRANSESOPHAGEAL ECHOCARDIOGRAM (TEE);  Surgeon: Wendall Stade, MD;  Location: [facility] ENDOSCOPY;  Service: Cardiovascular;  Laterality: N/A;    History  Smoking status  . Former Smoker  . Types: Cigarettes  . Quit date: 05/25/1991  Smokeless tobacco  . Not on file    History  Alcohol Use No    History   Social History  . Marital Status: Married    Spouse Name: N/A  . Number of Children: N/A  . Years of Education: N/A   Occupational History  . retired    Social History Main Topics  . Smoking status: Former Smoker    Types: Cigarettes    Quit date: 05/25/1991  . Smokeless tobacco: Not on file  . Alcohol Use: No  . Drug Use: No  . Sexual Activity: Not on file   Other Topics Concern  . Not on file   Social History Narrative    Allergies  Allergen Reactions  . Lovastatin Other (See Comments)    myalgias  . Lisinopril     itching    Current Facility-Administered Medications  Medication Dose Route Frequency Provider Last Rate Last Dose  . 0.9 %  sodium chloride infusion  250 mL Intravenous PRN Wilmon Pali, PA-C      . acetaminophen (TYLENOL) tablet 650 mg  650 mg Oral Q6H PRN Wilmon Pali, PA-C       Or  . acetaminophen (TYLENOL) suppository 650 mg  650 mg Rectal Q6H PRN Wilmon Pali, PA-C      . alum & mag hydroxide-simeth (MAALOX/MYLANTA) 200-200-20 MG/5ML suspension 30 mL  30 mL Oral Q6H PRN Wilmon Pali, PA-C      . atenolol (TENORMIN) tablet 100 mg  100 mg Oral Daily Gina L Collins, PA-C      . bisacodyl (DULCOLAX) suppository 10 mg  10 mg Rectal  Daily PRN Wilmon Pali, PA-C      . docusate sodium (COLACE) capsule 100 mg  100 mg Oral BID Almira Coaster  L Collins, PA-C      . enoxaparin (LOVENOX) injection 40 mg  40 mg Subcutaneous Q24H Gina L Collins, PA-C      . famotidine (PEPCID) tablet 20 mg  20 mg Oral BID Wilmon PaliGina L Collins, PA-C      . furosemide (LASIX) tablet 40 mg  40 mg Oral Daily Gina L Collins, PA-C      . losartan (COZAAR) tablet 50 mg  50 mg Oral Daily Gina L Collins, PA-C      . magnesium hydroxide (MILK OF MAGNESIA) suspension 30 mL  30 mL Oral Daily PRN Wilmon PaliGina L Collins, PA-C      . ondansetron (ZOFRAN) tablet 4 mg  4 mg Oral Q6H PRN Wilmon PaliGina L Collins, PA-C       Or  . ondansetron (ZOFRAN) injection 4 mg  4 mg Intravenous Q6H PRN Gina L Collins, PA-C      . sodium chloride 0.9 % injection 3 mL  3 mL Intravenous Q12H Gina L Collins, PA-C      . sodium chloride 0.9 % injection 3 mL  3 mL Intravenous Q12H Gina L Collins, PA-C      . sodium chloride 0.9 % injection 3 mL  3 mL Intravenous PRN Wilmon PaliGina L Collins, PA-C      . sodium phosphate (FLEET) 7-19 GM/118ML enema 1 enema  1 enema Rectal Once PRN Wilmon PaliGina L Collins, PA-C      . traMADol (ULTRAM) tablet 50 mg  50 mg Oral Q6H PRN Wilmon PaliGina L Collins, PA-C        Prescriptions prior to admission  Medication Sig Dispense Refill Last Dose  . atenolol (TENORMIN) 100 MG tablet Take 100 mg by mouth daily.   Taking  . furosemide (LASIX) 20 MG tablet Take 40 mg by mouth every morning.   Taking  . ibuprofen (ADVIL,MOTRIN) 200 MG tablet Take 400 mg by mouth every 6 (six) hours as needed. For pain   Taking  . losartan (COZAAR) 50 MG tablet Take 1 tablet by mouth daily.   Taking  . ranitidine (ZANTAC) 150 MG tablet Take 150 mg by mouth 2 (two) times daily.  0 Taking  . traMADol (ULTRAM) 50 MG tablet Take 50 mg by mouth as needed. 1 tab by mouth every 6 hrs as needed  0 Taking  . warfarin (COUMADIN) 5 MG tablet Take 5 mg by mouth daily at 6 PM.   Taking    Family History  Problem Relation Age of Onset  .  Heart disease Mother      Review of Systems:      Cardiac Review of Systems: Y or N  Chest Pain Cove.Etienne[y   ]  Resting SOB [  y ] Exertional SOB  [ y ]  Orthopnea [  y]   Pedal Edema [ y  ]    Palpitations [ y ] Syncope  [ n ]   Presyncope [ n  ]  General Review of Systems: [Y] = yes [  ]=no Constitional: recent weight change [ n]; anorexia [  ]; fatigue [ y ]; nausea [  ]n; night sweats [ y ]; fever [  n]; or chills Cove.Etienne[y ]  Dental: poor dentition[  ]; Last Dentist visit:   Eye : blurred vision [  ]; diplopia [   ]; vision changes [  ];  Amaurosis fugax[  ]; Resp: cough [  ];  wheezing[y  ];  hemoptysis[n  ]; shortness of breath[ y ]; paroxysmal nocturnal dyspnea[y  ]; dyspnea on exertion[  ]; or orthopnea[  ];  GI:  gallstones[  ], vomiting[  ];  dysphagia[  ]; melena[  ];  hematochezia [  ]; heartburn[  ];   Hx of  Colonoscopy[  ]; GU: kidney stones [  ]; hematuria[  ];   dysuria [  ];  nocturia[  ];  history of     obstruction [  ]; urinary frequency [  ]             Skin: rash, swelling[  ];, hair loss[  ];  peripheral edema[  ];  or itching[  ]; Musculosketetal: myalgias[  ];  joint swelling[  ];  joint erythema[  ];  joint pain[  ];  back pain[  ];  Heme/Lymph: bruising[  ];  bleeding[  ];  anemia[  ];  Neuro: TIA[  ];  headaches[  ];  stroke[  ];  vertigo[  ];  seizures[  ];   paresthesias[  ];  difficulty walking[n  ];  Psych:depression[  ]; anxiety[  ];  Endocrine: diabetes[  ];  thyroid dysfunction[  ];  Immunizations: Flu [ ? ]; Pneumococcal[?  ];  Other:  Physical Exam: BP 125/65 mmHg  Pulse 64  Temp(Src) 97.3 F (36.3 C) (Oral)  Resp 20  SpO2 93%   General appearance: alert, cooperative, appears older than stated age and moderately obese Head: Normocephalic, without obvious abnormality, atraumatic Neck: no adenopathy, no carotid bruit, no JVD, supple, symmetrical, trachea midline and thyroid not enlarged,  symmetric, no tenderness/mass/nodules Lymph nodes: Cervical, supraclavicular, and axillary nodes normal. Resp: diminished breath sounds bibasilar Back: symmetric, no curvature. ROM normal. No CVA tenderness. Cardio: regular rate and rhythm, S1, S2 normal, no murmur, click, rub or gallop GI: soft, non-tender; bowel sounds normal; no masses,  no organomegaly Extremities: edema bilaterial edema 2+ Neurologic: Grossly normal  Diagnostic Studies & Laboratory data:     Recent Radiology Findings:   No results found.  CT result in care everywhwere I have independently reviewed the above radiologic studies.  Recent Lab Findings: Lab Results  Component Value Date   WBC 22.1* 09/24/2014   HGB 16.2 09/24/2014   HCT 49.1 09/24/2014   PLT 88* 09/24/2014   GLUCOSE 169* 09/24/2014   NA 139 09/24/2014   K 4.7 09/24/2014   CL 107 09/24/2014   CREATININE 1.41* 09/24/2014   BUN 27* 09/24/2014   CO2 21* 09/24/2014   TSH 1.831 09/23/2014   INR 1.39 09/24/2014   HGBA1C 7.0* 09/23/2014   Bone Marrow BX:  Final Diagnosis Bone marrow, biopsy with aspirate: - Cellular marrow with trilineage hematopoiesis - No atypical infiltrates or metastatic carcinoma identified  Electronically Signed By Huel Cote. Claudette Laws, M.D. [facility]--- [place] [facility] [place], [place] [address], [place] [id number], Med. Dir., Graciella Belton. Kenton Kingfisher, M.D.   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 Plan admission and cultures work up by cardiology and ID before deciding on cardiac surgery and removal of lead  and closure of pfo on cardiopulmonary bypass   I  spent 55 minutes counseling the patient face to face and 50% or more the  time was spent in counseling and coordination of care. The total time spent in the appointment was 60 minutes.    Delight Ovens MD      [address] [place] [place] [address] Office [phone]   Beeper [phone]  10/10/2014 6:13 PM

## 2014-10-11 ENCOUNTER — Other Ambulatory Visit: Payer: Self-pay | Admitting: *Deleted

## 2014-10-11 ENCOUNTER — Encounter (HOSPITAL_COMMUNITY): Payer: Self-pay | Admitting: Nurse Practitioner

## 2014-10-11 ENCOUNTER — Inpatient Hospital Stay (HOSPITAL_COMMUNITY): Payer: Medicare Other

## 2014-10-11 DIAGNOSIS — G4733 Obstructive sleep apnea (adult) (pediatric): Secondary | ICD-10-CM

## 2014-10-11 DIAGNOSIS — E663 Overweight: Secondary | ICD-10-CM

## 2014-10-11 DIAGNOSIS — T827XXA Infection and inflammatory reaction due to other cardiac and vascular devices, implants and grafts, initial encounter: Principal | ICD-10-CM

## 2014-10-11 DIAGNOSIS — E119 Type 2 diabetes mellitus without complications: Secondary | ICD-10-CM

## 2014-10-11 DIAGNOSIS — I339 Acute and subacute endocarditis, unspecified: Secondary | ICD-10-CM

## 2014-10-11 DIAGNOSIS — I10 Essential (primary) hypertension: Secondary | ICD-10-CM

## 2014-10-11 DIAGNOSIS — I2699 Other pulmonary embolism without acute cor pulmonale: Secondary | ICD-10-CM

## 2014-10-11 DIAGNOSIS — B9689 Other specified bacterial agents as the cause of diseases classified elsewhere: Secondary | ICD-10-CM

## 2014-10-11 DIAGNOSIS — R0602 Shortness of breath: Secondary | ICD-10-CM

## 2014-10-11 DIAGNOSIS — N189 Chronic kidney disease, unspecified: Secondary | ICD-10-CM

## 2014-10-11 DIAGNOSIS — I38 Endocarditis, valve unspecified: Secondary | ICD-10-CM

## 2014-10-11 DIAGNOSIS — D689 Coagulation defect, unspecified: Secondary | ICD-10-CM

## 2014-10-11 DIAGNOSIS — R911 Solitary pulmonary nodule: Secondary | ICD-10-CM

## 2014-10-11 DIAGNOSIS — Z950 Presence of cardiac pacemaker: Secondary | ICD-10-CM

## 2014-10-11 DIAGNOSIS — I2510 Atherosclerotic heart disease of native coronary artery without angina pectoris: Secondary | ICD-10-CM

## 2014-10-11 LAB — CBC
HCT: 45.6 % (ref 39.0–52.0)
Hemoglobin: 15.3 g/dL (ref 13.0–17.0)
MCH: 28.5 pg (ref 26.0–34.0)
MCHC: 33.6 g/dL (ref 30.0–36.0)
MCV: 85.1 fL (ref 78.0–100.0)
Platelets: 130 10*3/uL — ABNORMAL LOW (ref 150–400)
RBC: 5.36 MIL/uL (ref 4.22–5.81)
RDW: 14.7 % (ref 11.5–15.5)
WBC: 8.1 10*3/uL (ref 4.0–10.5)

## 2014-10-11 LAB — BASIC METABOLIC PANEL
ANION GAP: 9 (ref 5–15)
BUN: 16 mg/dL (ref 6–20)
CALCIUM: 8.9 mg/dL (ref 8.9–10.3)
CO2: 27 mmol/L (ref 22–32)
Chloride: 103 mmol/L (ref 101–111)
Creatinine, Ser: 1.87 mg/dL — ABNORMAL HIGH (ref 0.61–1.24)
GFR calc Af Amer: 39 mL/min — ABNORMAL LOW (ref >60)
GFR, EST NON AFRICAN AMERICAN: 34 mL/min — AB (ref >60)
Glucose, Bld: 114 mg/dL — ABNORMAL HIGH (ref 65–99)
POTASSIUM: 3.9 mmol/L (ref 3.5–5.1)
Sodium: 139 mmol/L (ref 135–145)

## 2014-10-11 LAB — HIV ANTIBODY (ROUTINE TESTING W REFLEX): HIV SCREEN 4TH GENERATION: NONREACTIVE

## 2014-10-11 LAB — NM MYOCAR MULTI W/SPECT W/WALL MOTION / EF
LV dias vol: 75 mL
LVSYSVOL: 35 mL
Nuc Stress EF: 53 %
RATE: 0.41
SDS: 6
SRS: 2
SSS: 8
TID: 0.78

## 2014-10-11 LAB — SEDIMENTATION RATE: SED RATE: 21 mm/h — AB (ref 0–16)

## 2014-10-11 LAB — C-REACTIVE PROTEIN: CRP: 9.2 mg/dL — AB (ref <1.0)

## 2014-10-11 LAB — PROTIME-INR
INR: 4.03 — AB (ref 0.00–1.49)
Prothrombin Time: 38.2 seconds — ABNORMAL HIGH (ref 11.6–15.2)

## 2014-10-11 MED ORDER — TECHNETIUM TC 99M SESTAMIBI GENERIC - CARDIOLITE
30.0000 | Freq: Once | INTRAVENOUS | Status: AC | PRN
  Administered 2014-10-11: 30 via INTRAVENOUS

## 2014-10-11 MED ORDER — CEFTRIAXONE SODIUM IN DEXTROSE 40 MG/ML IV SOLN
2.0000 g | INTRAVENOUS | Status: DC
  Administered 2014-10-11 – 2014-10-14 (×4): 2 g via INTRAVENOUS
  Filled 2014-10-11 (×6): qty 50

## 2014-10-11 MED ORDER — REGADENOSON 0.4 MG/5ML IV SOLN
INTRAVENOUS | Status: AC
  Filled 2014-10-11: qty 5

## 2014-10-11 MED ORDER — CEFTRIAXONE SODIUM IN DEXTROSE 40 MG/ML IV SOLN
2.0000 g | Freq: Two times a day (BID) | INTRAVENOUS | Status: DC
  Filled 2014-10-11 (×2): qty 50

## 2014-10-11 MED ORDER — VANCOMYCIN HCL 10 G IV SOLR
1250.0000 mg | INTRAVENOUS | Status: DC
  Administered 2014-10-11 – 2014-10-16 (×6): 1250 mg via INTRAVENOUS
  Filled 2014-10-11 (×7): qty 1250

## 2014-10-11 MED ORDER — TECHNETIUM TC 99M SESTAMIBI GENERIC - CARDIOLITE
10.0000 | Freq: Once | INTRAVENOUS | Status: AC | PRN
Start: 2014-10-11 — End: 2014-10-11
  Administered 2014-10-11: 10 via INTRAVENOUS

## 2014-10-11 MED ORDER — SODIUM CHLORIDE 0.9 % IV SOLN
300.0000 mg | Freq: Two times a day (BID) | INTRAVENOUS | Status: DC
  Administered 2014-10-11 – 2014-10-17 (×12): 300 mg via INTRAVENOUS
  Filled 2014-10-11 (×15): qty 300

## 2014-10-11 MED ORDER — REGADENOSON 0.4 MG/5ML IV SOLN
0.4000 mg | Freq: Once | INTRAVENOUS | Status: AC
  Administered 2014-10-11: 0.4 mg via INTRAVENOUS
  Filled 2014-10-11: qty 5

## 2014-10-11 NOTE — Progress Notes (Signed)
ANTIBIOTIC CONSULT NOTE - INITIAL  Pharmacy Consult for Vancomcyin Indication: endocarditis  Allergies  Allergen Reactions  . Lovastatin Other (See Comments)    myalgias  . Lisinopril Itching    Patient Measurements: Height: 5' 3.5" (161.3 cm) Weight: 245 lb 6.4 oz (111.313 kg) IBW/kg (Calculated) : 58.05  Vital Signs: Temp: 98.4 F (36.9 C) (05/20 1446) Temp Source: Oral (05/20 1446) BP: 101/53 mmHg (05/20 1446) Pulse Rate: 62 (05/20 1446)   Labs:  Recent Labs  10/10/14 1925 10/11/14 0330  WBC 9.2 8.1  HGB 15.2 15.3  PLT 128* 130*  CREATININE 1.57* 1.87*   Estimated Creatinine Clearance: 39.5 mL/min (by C-G formula based on Cr of 1.87).  Microbiology: Recent Results (from the past 720 hour(s))  Culture, blood (single)     Status: None   Collection Time: 09/26/14  2:51 PM  Result Value Ref Range Status   Organism ID, Bacteria NO GROWTH 5 DAYS  Final  Culture, blood (single)     Status: None   Collection Time: 09/26/14  2:54 PM  Result Value Ref Range Status   Organism ID, Bacteria STAPHYLOCOCCUS SPECIES (COAGULASE NEGATIVE)  Final    Comment: The significance of isolating this organism from a single set of blood cultures when multiple sets are drawn is uncertain. Please notify the Microbiology Department within one week if speciation and sensitivities are required. Culture results may be compromised due to an excessive volume of blood received in culture bottles. Gram Stain Report Called to,Read Back By and Verified With: MICHELLE SWINYER 09/30/14 1010 BY SMITHERSJ     Medical History: Past Medical History  Diagnosis Date  . 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 Overweight(278.02)   . Hyperlipemia   . CAD (coronary artery disease)     a. details unclear  . HTN (hypertension)   . Osteoarthritis   . GERD (gastroesophageal reflux disease)   . BPH (benign prostatic hyperplasia)   . OSA (obstructive sleep apnea)     a. on CPAP  . Type 2  diabetes mellitus   . COPD (chronic obstructive pulmonary disease)   . Renal impairment   . Chronic anxiety   . Obesity    Assessment:   Spoke with Dr. Ninetta LightsHatcher earlier today.   To begin Vanc, Cetriaxone and Rifampin for endocardidits coverage after repeat blood cultures are drawn.  One of 2 blood cultures from 09/26/14 grew Coag-negative Staph.  TEE 10/03/14 showed vegetation on ventricular lead of pacer and PFO.    INR 4.03 today. Recent PE. Coumadin on hold for possible pacer extraction.  Goal of Therapy:  Vancomycin trough level 15-20 mcg/ml  Plan:   Antibiotics to begin after blood cultures are drawn this afternoon.  RN aware. First antibiotic dose (Ceftriaxone) scheduled for 6pm, but will be moved forward if cultures are delayed.  Vancomycin 1250 mg IV q24hrs.  Ceftriaxone 2 gm IV q24hrs.   Rifmapin 300 mg IV q12hrs.  Will follow renal function, culture data, progress and plans.  Will plan to check steady-state vanc trough.  Coumadin on hold.    Daily PT/INR ordered.    Dennie FettersEgan, [place] Lusty Donovan, RPh Pager: [phone] 10/11/2014,3:41 PM
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
## 2014-10-11 NOTE — Progress Notes (Signed)
Patient has PT of 38.9 and INR of 4.12 at 1925 on 5/19. Notified CTS on call. Will continue to monitor accordingly.

## 2014-10-11 NOTE — Progress Notes (Signed)
Called lab to confirm when the last set of blood cultures will be drawn I was informed that the cultures would be draw as soon as possible that there was a rejection  And that paper work had to be done over /changing shifts. Patient and family made aware that lab would be drawn soon because of the concerns that they had and charge nurse made aware of situation..Kenneth [name]
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
## 2014-10-11 NOTE — Progress Notes (Addendum)
      [address]       Kenneth [name],[name] [id number]             [phone]             Subjective: Patient without specific complaints. He denies chest pain or shortness of breath. States he gets chills/rigors "around late evening news time".  Objective: Vital signs in last 24 hours: Temp:  [97.3 F (36.3 C)-98.4 F (36.9 C)] 97.6 F (36.4 C) (05/20 0455) Pulse Rate:  [63-71] 64 (05/20 0455) Cardiac Rhythm:  [-] Normal sinus rhythm (05/19 1930) Resp:  [19-21] 21 (05/20 0455) BP: (93-132)/(53-79) 132/62 mmHg (05/20 0455) SpO2:  [92 %-94 %] 94 % (05/20 0455) Weight:  [245 lb 6.4 oz (111.313 kg)-252 lb (114.306 kg)] 245 lb 6.4 oz (111.313 kg) (05/20 0455)   Current Weight  10/11/14 245 lb 6.4 oz (111.313 kg)      Physical Exam:  Cardiovascular: RRR, no murmurs, gallops, or rubs. Pulmonary: Diminished at bases; no rales, wheezes, or rhonchi. Abdomen: Soft, obese, non tender, bowel sounds present. Extremities: Bilateral lower extremity edema.   Lab Results: CBC: Recent Labs  10/10/14 1925 10/11/14 0330  WBC 9.2 8.1  HGB 15.2 15.3  HCT 44.7 45.6  PLT 128* 130*   BMET:  Recent Labs  10/10/14 1925 10/11/14 0330  NA 137 139  K 3.8 3.9  CL 104 103  CO2 22 27  GLUCOSE 140* 114*  BUN 19 16  CREATININE 1.57* 1.87*  CALCIUM 8.6* 8.9    PT/INR:  Lab Results  Component Value Date   INR 4.03* 10/11/2014   INR 4.12* 10/10/2014   INR 1.39 09/24/2014   ABG:  INR: Will add last result for INR, ABG once components are confirmed Will add last 4 CBG results once components are confirmed  Assessment/Plan:  1. CV - Vegetation on ventricular lead of TV and large PFO. Await results of Lexi scan. On Atenolol 100 mg daily. May need cardiac cath. EPS/cardiology following. 0.2.  Pulmonary - Has PE. Previously on Coumadin 3. Await infectious disease evaluation 4. Creatinine 1.87 this am. He has a history of renal insufficiency.  Kenneth [name]  MPA-C 10/11/2014,8:44 AM  Results of myoviewe pending Holding coumadin  ckd noted cr elevated more today Chronic Kidney Disease   Stage I     GFR >90  Stage II    GFR 60-89  Stage IIIA GFR 45-59  Stage IIIB GFR 30-44  Stage IV   GFR 15-29  Stage V    GFR  <15  Lab Results  Component Value Date   CREATININE 1.87* 10/11/2014   Estimated Creatinine Clearance: 39.5 mL/min (by C-G formula based on Cr of 1.87).  CKD stage IIIB I have seen and examined Kenneth [name] and agree with the above assessment  and plan.  Delight OvensEdward B Brieonna Crutcher MD Beeper [phone] Office [phone] 10/11/2014 1:39 PM

## 2014-10-11 NOTE — Progress Notes (Signed)
Patient ID: [name], male   DOB: Mar 20, 1941, 74 y.o.   MRN: [id number] [name] was not in room on my visit today, I think gone for lexiscan myoview. Will ask Dr. Johney FrameAllred who is here today to followup after Myoview. If scan demonstrates high risk study then cardiac catheterization will be recommended.  Leonia ReevesGregg Adolphe Fortunato,M.D.

## 2014-10-11 NOTE — Telephone Encounter (Signed)
Dr Tyrone SageGerhardt spoke with Dr Ladona RidgelAylor yesterday and patient is in the hospital now

## 2014-10-11 NOTE — Progress Notes (Signed)
Utilization review completed.  

## 2014-10-11 NOTE — Progress Notes (Signed)
    Subjective:  No complaints of SOB  Objective:  Vital Signs in the last 24 hours: Temp:  [97.3 F (36.3 C)-98.4 F (36.9 C)] 97.6 F (36.4 C) (05/20 0455) Pulse Rate:  [61-71] 67 (05/20 0924) Resp:  [19-21] 21 (05/20 0455) BP: (93-139)/(53-95) 139/84 mmHg (05/20 0924) SpO2:  [92 %-94 %] 94 % (05/20 0455) Weight:  [245 lb 6.4 oz (111.313 kg)-252 lb (114.306 kg)] 245 lb 6.4 oz (111.313 kg) (05/20 0455)  Intake/Output from previous day: No intake or output data in the 24 hours ending 10/11/14 0926  Physical Exam: General appearance: alert, cooperative, no distress and morbidly obese Neck: no JVD Lungs: clear to auscultation bilaterally Heart: regular rate and rhythm Abdomen: obese Extremities: no edema Skin: Skin color, texture, turgor normal. No rashes or lesions Neurologic: Grossly normal   Rate: 68  Rhythm: normal sinus rhythm  Lab Results:  Recent Labs  10/10/14 1925 10/11/14 0330  WBC 9.2 8.1  HGB 15.2 15.3  PLT 128* 130*    Recent Labs  10/10/14 1925 10/11/14 0330  NA 137 139  K 3.8 3.9  CL 104 103  CO2 22 27  GLUCOSE 140* 114*  BUN 19 16  CREATININE 1.57* 1.87*   No results for input(s): TROPONINI in the last 72 hours.  Invalid input(s): CK, MB  Recent Labs  10/11/14 0330  INR 4.03*    Scheduled Meds: . atenolol  100 mg Oral Daily  . docusate sodium  100 mg Oral BID  . enoxaparin (LOVENOX) injection  40 mg Subcutaneous Q24H  . famotidine  20 mg Oral BID  . furosemide  40 mg Oral Daily  . losartan  50 mg Oral Daily  . regadenoson      . sodium chloride  3 mL Intravenous Q12H  . sodium chloride  3 mL Intravenous Q12H   Continuous Infusions:  PRN Meds:.sodium chloride, acetaminophen **OR** acetaminophen, alum & mag hydroxide-simeth, bisacodyl, magnesium hydroxide, ondansetron **OR** ondansetron (ZOFRAN) IV, sodium chloride, traMADol   Imaging: Imaging results have been reviewed   Assessment/Plan:   Principal Problem:   Acute  endocarditis Active Problems:   Pacemaker infection   CAD- remote "ruptured coronary"   PACEMAKER, PERMANENT   Chronic renal insufficiency, stage III (moderate)   Obesity- BMI 42   Benign essential HTN   Obstructive apnea- C-pap intol   PLAN: Lexiscan Myoview this am- pt tolerated well, images pending.   Smith InternationalLuke Lattie Cervi PA-C 10/11/2014, 9:26 AM [phone]

## 2014-10-12 LAB — COMPREHENSIVE METABOLIC PANEL
ALT: 23 U/L (ref 17–63)
AST: 25 U/L (ref 15–41)
Albumin: 3.1 g/dL — ABNORMAL LOW (ref 3.5–5.0)
Alkaline Phosphatase: 66 U/L (ref 38–126)
Anion gap: 11 (ref 5–15)
BUN: 20 mg/dL (ref 6–20)
CO2: 27 mmol/L (ref 22–32)
Calcium: 8.7 mg/dL — ABNORMAL LOW (ref 8.9–10.3)
Chloride: 102 mmol/L (ref 101–111)
Creatinine, Ser: 1.86 mg/dL — ABNORMAL HIGH (ref 0.61–1.24)
GFR calc Af Amer: 40 mL/min — ABNORMAL LOW (ref >60)
GFR calc non Af Amer: 34 mL/min — ABNORMAL LOW (ref >60)
Glucose, Bld: 110 mg/dL — ABNORMAL HIGH (ref 65–99)
Potassium: 4 mmol/L (ref 3.5–5.1)
Sodium: 140 mmol/L (ref 135–145)
Total Bilirubin: 1.3 mg/dL — ABNORMAL HIGH (ref 0.3–1.2)
Total Protein: 7 g/dL (ref 6.5–8.1)

## 2014-10-12 LAB — PROTIME-INR
INR: 3.39 — ABNORMAL HIGH (ref 0.00–1.49)
Prothrombin Time: 33.6 seconds — ABNORMAL HIGH (ref 11.6–15.2)

## 2014-10-12 LAB — CBC
HCT: 47.4 % (ref 39.0–52.0)
Hemoglobin: 15.7 g/dL (ref 13.0–17.0)
MCH: 28.1 pg (ref 26.0–34.0)
MCHC: 33.1 g/dL (ref 30.0–36.0)
MCV: 84.8 fL (ref 78.0–100.0)
Platelets: 149 10*3/uL — ABNORMAL LOW (ref 150–400)
RBC: 5.59 MIL/uL (ref 4.22–5.81)
RDW: 14.5 % (ref 11.5–15.5)
WBC: 7.5 10*3/uL (ref 4.0–10.5)

## 2014-10-12 LAB — HEMOGLOBIN A1C
HEMOGLOBIN A1C: 7.2 % — AB (ref 4.8–5.6)
MEAN PLASMA GLUCOSE: 160 mg/dL
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
## 2014-10-13 ENCOUNTER — Inpatient Hospital Stay (HOSPITAL_COMMUNITY): Payer: Medicare Other

## 2014-10-13 LAB — CULTURE, BLOOD (SINGLE)

## 2014-10-13 LAB — PROTIME-INR
INR: 2.52 — ABNORMAL HIGH (ref 0.00–1.49)
Prothrombin Time: 26.9 seconds — ABNORMAL HIGH (ref 11.6–15.2)

## 2014-10-13 NOTE — Progress Notes (Signed)
VASCULAR LAB PRELIMINARY  PRELIMINARY  PRELIMINARY  PRELIMINARY  Pre-op Cardiac Surgery  Carotid Findings:  Bilateral:  1-39% ICA stenosis.  Vertebral artery flow is antegrade.     Upper Extremity Right Left  Brachial Pressures 114 Triphasic IV placement Triphasic  Radial Waveforms Triphasic Triphasic  Ulnar Waveforms Triphasic Triphasic  Palmar Arch (Allen's Test) Normal Normal   Findings:  Doppler waveforms remained normal bilaterally with both radial and ulnar compressions    Lower  Extremity Right Left  Dorsalis Pedis    Anterior Tibial    Posterior Tibial    Ankle/Brachial Indices      Findings:  Pedal pulses were palpable bilaterally   Paisli Silfies, RVS 10/13/2014, 3:38 PM

## 2014-10-13 NOTE — Progress Notes (Addendum)
      [address]       [name] [id number]             [phone]        Procedure(s) (LRB): REMOVAL FOREIGN BODY ON PUMP (Right) CANNULATION FOR CARDIOPULMONARY BYPASS (N/A) CLOSURE OF PATENT FORAMEN OVALE (N/A) PACEMAKER LEAD EXTRACTION (Right) Subjective: Feels ok, no new c/o  Objective: Vital signs in last 24 hours: Temp:  [97.8 F (36.6 C)-98.1 F (36.7 C)] 98.1 F (36.7 C) (05/22 0520) Pulse Rate:  [67-74] 67 (05/22 0930) Cardiac Rhythm:  [-] Normal sinus rhythm (05/21 2000) Resp:  [17-18] 17 (05/22 0520) BP: (114-131)/(65-83) 123/74 mmHg (05/22 0930) SpO2:  [91 %-93 %] 91 % (05/22 0520) Weight:  [236 lb 5.3 oz (107.2 kg)] 236 lb 5.3 oz (107.2 kg) (05/22 0520)  Hemodynamic parameters for last 24 hours:    Intake/Output from previous day: 05/21 0701 - 05/22 0700 In: 723 [P.O.:720; I.V.:3] Out: 50 [Urine:50] Intake/Output this shift: Total I/O In: 3 [I.V.:3] Out: -   General appearance: no distress Heart: regular rate and rhythm Lungs: clear to auscultation bilaterally Abdomen: benign Extremities: no edema  Lab Results:  Recent Labs  10/11/14 0330 10/12/14 0339  WBC 8.1 7.5  HGB 15.3 15.7  HCT 45.6 47.4  PLT 130* 149*   BMET:  Recent Labs  10/11/14 0330 10/12/14 0339  NA 139 140  K 3.9 4.0  CL 103 102  CO2 27 27  GLUCOSE 114* 110*  BUN 16 20  CREATININE 1.87* 1.86*  CALCIUM 8.9 8.7*    PT/INR:  Recent Labs  10/13/14 0515  LABPROT 26.9*  INR 2.52*   ABG No results found for: PHART, HCO3, TCO2, ACIDBASEDEF, O2SAT CBG (last 3)  No results for input(s): GLUCAP in the last 72 hours.  Meds Scheduled Meds: . atenolol  100 mg Oral Daily  . cefTRIAXone (ROCEPHIN)  IV  2 g Intravenous Q24H  . docusate sodium  100 mg Oral BID  . famotidine  20 mg Oral BID  . furosemide  40 mg Oral Daily  . losartan  50 mg Oral Daily  . rifampin (RIFADIN) IVPB  300 mg Intravenous Q12H  . sodium chloride  3 mL Intravenous Q12H  .  sodium chloride  3 mL Intravenous Q12H  . vancomycin  1,250 mg Intravenous Q24H   Continuous Infusions:  PRN Meds:.sodium chloride, acetaminophen **OR** acetaminophen, alum & mag hydroxide-simeth, bisacodyl, magnesium hydroxide, ondansetron **OR** ondansetron (ZOFRAN) IV, sodium chloride, traMADol  Xrays Nm Myocar Multi W/spect W/wall Motion / Ef  10/11/2014    There was no ST segment deviation noted during stress.  Defect 1: There is a medium defect of moderate severity present in the  basal inferior, mid inferior and apical inferior location.  This is a low risk study.  The left ventricular ejection fraction is mildly decreased (45-54%).     Assessment/Plan: S/P Procedure(s) (LRB): REMOVAL FOREIGN BODY ON PUMP (Right) CANNULATION FOR CARDIOPULMONARY BYPASS (N/A) CLOSURE OF PATENT FORAMEN OVALE (N/A) PACEMAKER LEAD EXTRACTION (Right)  1 afebrile, hemodyn stable on current rx/tx 2 planning of intervention being formulated by surgeon and consultants    LOS: 3 days    GOLD,WAYNE E 10/13/2014  Patient seen and examined, agree with above INR coming down Continue IV antibiotics  Viviann SpareSteven C. Dorris FetchHendrickson, MD [facility] [phone]

## 2014-10-14 LAB — BASIC METABOLIC PANEL
Anion gap: 13 (ref 5–15)
BUN: 23 mg/dL — ABNORMAL HIGH (ref 6–20)
CO2: 21 mmol/L — ABNORMAL LOW (ref 22–32)
Calcium: 8.7 mg/dL — ABNORMAL LOW (ref 8.9–10.3)
Chloride: 103 mmol/L (ref 101–111)
Creatinine, Ser: 1.83 mg/dL — ABNORMAL HIGH (ref 0.61–1.24)
GFR calc Af Amer: 41 mL/min — ABNORMAL LOW (ref >60)
GFR calc non Af Amer: 35 mL/min — ABNORMAL LOW (ref >60)
Glucose, Bld: 113 mg/dL — ABNORMAL HIGH (ref 65–99)
Potassium: 4 mmol/L (ref 3.5–5.1)
Sodium: 137 mmol/L (ref 135–145)

## 2014-10-14 LAB — CBC
HCT: 46.4 % (ref 39.0–52.0)
Hemoglobin: 15.9 g/dL (ref 13.0–17.0)
MCH: 28.9 pg (ref 26.0–34.0)
MCHC: 34.3 g/dL (ref 30.0–36.0)
MCV: 84.4 fL (ref 78.0–100.0)
Platelets: 145 10*3/uL — ABNORMAL LOW (ref 150–400)
RBC: 5.5 MIL/uL (ref 4.22–5.81)
RDW: 14.4 % (ref 11.5–15.5)
WBC: 6.8 10*3/uL (ref 4.0–10.5)

## 2014-10-14 LAB — HEPATITIS C ANTIBODY (REFLEX): HCV Ab: NEGATIVE

## 2014-10-14 LAB — VANCOMYCIN, TROUGH: Vancomycin Tr: 16 ug/mL (ref 10.0–20.0)

## 2014-10-14 LAB — PROTIME-INR
INR: 1.88 — AB (ref 0.00–1.49)
Prothrombin Time: 21.5 seconds — ABNORMAL HIGH (ref 11.6–15.2)

## 2014-10-14 MED ORDER — HEPARIN (PORCINE) IN NACL 100-0.45 UNIT/ML-% IJ SOLN
1150.0000 [IU]/h | INTRAMUSCULAR | Status: DC
  Administered 2014-10-14 – 2014-10-15 (×2): 1300 [IU]/h via INTRAVENOUS
  Filled 2014-10-14 (×5): qty 250

## 2014-10-14 NOTE — Progress Notes (Signed)
ANTIBIOTIC CONSULT NOTE - FOLLOW UP  Pharmacy Consult for Vancomycin Indication: endocarditis  Allergies  Allergen Reactions  . Lovastatin Other (See Comments)    myalgias  . Lisinopril Itching    Patient Measurements: Height: 5' 3.5" (161.3 cm) Weight: 243 lb 6.2 oz (110.4 kg) IBW/kg (Calculated) : 58.05 Adjusted Body Weight:    Vital Signs: Temp: 98.1 F (36.7 C) (05/23 2027) Temp Source: Oral (05/23 2027) BP: 142/77 mmHg (05/23 2027) Pulse Rate: 68 (05/23 2027) Intake/Output from previous day: 05/22 0701 - 05/23 0700 In: 1883 [P.O.:480; I.V.:3; IV Piggyback:1400] Out: 1200 [Urine:1200] Intake/Output from this shift: Total I/O In: 150 [IV Piggyback:150] Out: 350 [Urine:350]  Labs:  Recent Labs  10/12/14 0339 10/14/14 0430  WBC 7.5 6.8  HGB 15.7 15.9  PLT 149* 145*  CREATININE 1.86* 1.83*   Estimated Creatinine Clearance: 40.2 mL/min (by C-G formula based on Cr of 1.83).  Recent Labs  10/14/14 2050  VANCOTROUGH 16     Microbiology: Recent Results (from the past 720 hour(s))  Culture, blood (single)     Status: None   Collection Time: 09/26/14  2:51 PM  Result Value Ref Range Status   Organism ID, Bacteria NO GROWTH 5 DAYS  Final  Culture, blood (single)     Status: None   Collection Time: 09/26/14  2:54 PM  Result Value Ref Range Status   Culture STAPHYLOCOCCUS SPECIES (COAGULASE NEGATIVE)  Final    Comment: FAX [phone] Culture results may be compromised due to an excessive volume of blood received in culture bottles. Gram Stain Report Called to,Read Back By and Verified With: MICHELLE SWINYER 09/30/14 1010 BY SMITHERSJ    Organism ID, Bacteria STAPHYLOCOCCUS SPECIES (COAGULASE NEGATIVE)  Final      Susceptibility   Staphylococcus species (coagulase negative) -  (no method available)    PENICILLIN <=0.03 Sensitive     OXACILLIN  Sensitive     GENTAMICIN <=0.5 Sensitive     CIPROFLOXACIN <=0.5 Sensitive     LEVOFLOXACIN 0.25 Sensitive      TRIMETH/SULFA <=10 Sensitive     VANCOMYCIN 1 Sensitive     CLINDAMYCIN <=0.25 Sensitive     ERYTHROMYCIN <=0.25 Sensitive     RIFAMPIN <=0.5 Sensitive     TETRACYCLINE 2 Sensitive   Culture, blood (routine x 2)     Status: None (Preliminary result)   Collection Time: 10/10/14  7:12 PM  Result Value Ref Range Status   Specimen Description BLOOD LEFT ARM  Final   Special Requests BOTTLES DRAWN AEROBIC ONLY .5CC  Final   Culture   Final           BLOOD CULTURE RECEIVED NO GROWTH TO DATE CULTURE WILL BE HELD FOR 5 DAYS BEFORE ISSUING A FINAL NEGATIVE REPORT Performed at [facility]    Report Status PENDING  Incomplete  Culture, blood (routine x 2)     Status: None (Preliminary result)   Collection Time: 10/10/14  7:19 PM  Result Value Ref Range Status   Specimen Description BLOOD RIGHT HAND  Final   Special Requests BOTTLES DRAWN AEROBIC ONLY 2 CC  Final   Culture   Final           BLOOD CULTURE RECEIVED NO GROWTH TO DATE CULTURE WILL BE HELD FOR 5 DAYS BEFORE ISSUING A FINAL NEGATIVE REPORT Performed at [facility]    Report Status PENDING  Incomplete  Culture, blood (routine x 2)     Status: None (Preliminary result)  Collection Time: 10/11/14  4:21 PM  Result Value Ref Range Status   Specimen Description BLOOD RIGHT ANTECUBITAL  Final   Special Requests BOTTLES DRAWN AEROBIC AND ANAEROBIC 10CC  Final   Culture   Final    GRAM POSITIVE COCCI IN CLUSTERS Note: Gram Stain Report Called to,Read Back By and Verified With: J.JURIS 5.22.16  710PM BY MANGR [facility]    Report Status PENDING  Incomplete  Culture, blood (routine x 2)     Status: None (Preliminary result)   Collection Time: 10/11/14  4:40 PM  Result Value Ref Range Status   Specimen Description BLOOD RIGHT HAND  Final   Special Requests   Final    BOTTLES DRAWN AEROBIC AND ANAEROBIC 10CC AER 5CC ANA   Culture   Final    GRAM POSITIVE COCCI IN CLUSTERS Note: Gram Stain  Report Called to,Read Back By and Verified With: Lyndee LeoJ. JURIS RN 710P [facility]formed at [facility]    Report Status PENDING  Incomplete  Culture, blood (routine x 2)     Status: None (Preliminary result)   Collection Time: 10/11/14  7:12 PM  Result Value Ref Range Status   Specimen Description BLOOD RIGHT HAND  Final   Special Requests BOTTLES DRAWN AEROBIC AND ANAEROBIC 5CC  Final   Culture   Final           BLOOD CULTURE RECEIVED NO GROWTH TO DATE CULTURE WILL BE HELD FOR 5 DAYS BEFORE ISSUING A FINAL NEGATIVE REPORT [facility]formed at [facility]    Report Status PENDING  Incomplete    Anti-infectives    Start     Dose/Rate Route Frequency Ordered Stop   10/11/14 2100  vancomycin (VANCOCIN) 1,250 mg in sodium chloride 0.9 % 250 mL IVPB     1,250 mg 166.7 mL/hr over 90 Minutes Intravenous Every 24 hours 10/11/14 1541     10/11/14 1800  cefTRIAXone (ROCEPHIN) 2 g in dextrose 5 % 50 mL IVPB - Premix     2 g 100 mL/hr over 30 Minutes Intravenous Every 24 hours 10/11/14 1403     10/11/14 1430  cefTRIAXone (ROCEPHIN) 2 g in dextrose 5 % 50 mL IVPB - Premix  Status:  Discontinued     2 g 100 mL/hr over 30 Minutes Intravenous Every 12 hours 10/11/14 1357 10/11/14 1403   10/11/14 1430  rifampin (RIFADIN) 300 mg in sodium chloride 0.9 % 100 mL IVPB     300 mg 200 mL/hr over 30 Minutes Intravenous Every 12 hours 10/11/14 1357        Assessment: Pacer endocarditis with staph epi, 3 cm vegetation on ventrilcular lead at level of tricuspid valve and large PFO seen on TEE 09/26/14. On vanc per Rx plus CTX and Rifampin IV. Afebrile, WBC 6.8. ID following. Vanco trough 16 in goal.  Vancomycin 5/20 >>  Rocephin 5/20 >>  Rifampin 5/20 >>   5/19 blood x 2- ngtd  5/20 blood x 3 - GPC/clusters 2/3  09/24/14 - blood x 2 - neg  5/5 Cx from [place] with Staph epi   Goal of Therapy:  Vancomycin trough level 15-20 mcg/ml  Plan:  Continue Vancomycin 1250mg  IV q24h   Irfan Veal S. Merilynn Finlandobertson,  PharmD, BCPS Clinical Staff Pharmacist Pager [phone]  Misty Stanleyobertson, Amrom Ore Stillinger 10/14/2014,9:54 PM

## 2014-10-14 NOTE — Progress Notes (Signed)
Medicare Important Message given? YES  (If response is "NO", the following Medicare IM given date fields will be blank)  Date Medicare IM given: 10/14/14 Medicare IM given by:  Erikah Thumm  

## 2014-10-14 NOTE — Progress Notes (Addendum)
      [address]       [name] [id number]             [phone]          Procedure(s) (LRB): REMOVAL FOREIGN BODY ON PUMP (Right) CANNULATION FOR CARDIOPULMONARY BYPASS (N/A) CLOSURE OF PATENT FORAMEN OVALE (N/A) PACEMAKER LEAD EXTRACTION (Right)  Subjective: Patient without complaints. He is waiting to talk to Dr. Tyrone SageGerhardt for "the plan"  Objective: Vital signs in last 24 hours: Temp:  [97.7 F (36.5 C)-98.1 F (36.7 C)] 97.8 F (36.6 C) (05/23 0422) Pulse Rate:  [67-74] 74 (05/23 0422) Cardiac Rhythm:  [-] Normal sinus rhythm (05/22 0816) Resp:  [18-20] 20 (05/23 0422) BP: (123-142)/(73-74) 140/74 mmHg (05/23 0422) SpO2:  [90 %-95 %] 91 % (05/23 0422) Weight:  [243 lb 6.2 oz (110.4 kg)] 243 lb 6.2 oz (110.4 kg) (05/23 0422)   Current Weight  10/14/14 243 lb 6.2 oz (110.4 kg)      Intake/Output from previous day: 05/22 0701 - 05/23 0700 In: 483 [P.O.:480; I.V.:3] Out: 1200 [Urine:1200]   Physical Exam:  Cardiovascular: RRR Pulmonary: Clear to auscultation bilaterally; no rales, wheezes, or rhonchi. Abdomen: Soft, non tender, bowel sounds present. Extremities: No lower extremity edema.   Lab Results: CBC: Recent Labs  10/12/14 0339 10/14/14 0430  WBC 7.5 6.8  HGB 15.7 15.9  HCT 47.4 46.4  PLT 149* 145*   BMET:  Recent Labs  10/12/14 0339 10/14/14 0430  NA 140 137  K 4.0 4.0  CL 102 103  CO2 27 21*  GLUCOSE 110* 113*  BUN 20 23*  CREATININE 1.86* 1.83*  CALCIUM 8.7* 8.7*    PT/INR:  Lab Results  Component Value Date   INR 1.88* 10/14/2014   INR 2.52* 10/13/2014   INR 3.39* 10/12/2014   ABG:  INR: Will add last result for INR, ABG once components are confirmed Will add last 4 CBG results once components are confirmed  Assessment/Plan:  1. CV - Endocarditis.SR in the 70's. On Atenolol 100 mg daily and Cozaar 50 mg daily. INR decreased from 2.52 to 1.88. Dr. Tyrone SageGerhardt to evaluate today. 2. ID-On Rocephin,  Rifampin, and Vanco.   ZIMMERMAN,DONIELLE MPA-C 10/14/2014,7:26 AM  INR down from 4 to 1.8, stated on heparin bridge Considering surgical removal of lead and closure of patent foramen Wednesday. cardiology was evaluating coronary artery disease : report : Study Impression Myocardial perfusion is abnormal. Abnormal perfusion with a fixed inferior defect that represents scar or diaphragmatic attenuation. This is a low risk study. Overall left ventricular systolic function was normal. LV cavity size is normal. The left ventricular ejection fraction is mildly decreased (45-54%). There is no prior study for comparison. Waiting for follow from cardiology I have seen and examined Harl Favorlyde Hicks and agree with the above assessment  and plan.  Delight OvensEdward B Ruhama Lehew MD [facility] [phone] Office [phone] 10/14/2014 6:27 PM

## 2014-10-14 NOTE — Progress Notes (Signed)
ANTICOAGULATION CONSULT NOTE - Initial Consult  Pharmacy Consult for heparin  Indication: recent PE  Allergies  Allergen Reactions  . Lovastatin Other (See Comments)    myalgias  . Lisinopril Itching    Patient Measurements: Height: 5' 3.5" (161.3 cm) Weight: 243 lb 6.2 oz (110.4 kg) IBW/kg (Calculated) : 58.05 Heparin Dosing Weight: 83kg  Vital Signs: Temp: 98.2 F (36.8 C) (05/23 1300) Temp Source: Oral (05/23 1300) BP: 124/64 mmHg (05/23 1300) Pulse Rate: 80 (05/23 1300)  Labs:  Recent Labs  10/12/14 0339 10/13/14 0515 10/14/14 0430  HGB 15.7  --  15.9  HCT 47.4  --  46.4  PLT 149*  --  145*  LABPROT 33.6* 26.9* 21.5*  INR 3.39* 2.52* 1.88*  CREATININE 1.86*  --  1.83*    Estimated Creatinine Clearance: 40.2 mL/min (by C-G formula based on Cr of 1.83).   Medical History: Past Medical History  Diagnosis Date  . 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 Overweight(278.02)   . Hyperlipemia   . CAD (coronary artery disease)     a. details unclear  . HTN (hypertension)   . Osteoarthritis   . GERD (gastroesophageal reflux disease)   . BPH (benign prostatic hyperplasia)   . OSA (obstructive sleep apnea)     a. on CPAP  . Type 2 diabetes mellitus   . COPD (chronic obstructive pulmonary disease)   . Renal impairment   . Chronic anxiety   . Obesity     Medications:  Scheduled:  . atenolol  100 mg Oral Daily  . cefTRIAXone (ROCEPHIN)  IV  2 g Intravenous Q24H  . docusate sodium  100 mg Oral BID  . famotidine  20 mg Oral BID  . furosemide  40 mg Oral Daily  . losartan  50 mg Oral Daily  . rifampin (RIFADIN) IVPB  300 mg Intravenous Q12H  . sodium chloride  3 mL Intravenous Q12H  . sodium chloride  3 mL Intravenous Q12H  . vancomycin  1,250 mg Intravenous Q24H    Assessment: 74 yo male with recent PE (~ 2 months ago) on coumadin PTA and now on hold for pacer lead extraction and PFO closure. INR today is 1.88, hg/hct= 15.9/46.4, plt=  145.  -Spoke with Dr. Tyrone SageGerhardt and ok to begin heparin bridge.   Goal of Therapy:  Heparin level 0.3-0.7 units/ml Monitor platelets by anticoagulation protocol: Yes   Plan:  -No heparin bolus with INR of 1.88 -Begin heparin at 1300 units/hr (~ 16 units/kg/hr) -Heparin level in 8 hrs -Will follow plans for OR  Kenneth [name], Pharm D 10/14/2014 2:33 PM

## 2014-10-14 NOTE — Progress Notes (Signed)
INFECTIOUS DISEASE PROGRESS NOTE  ID: Kenneth [name] is a 74 y.o. male with  Principal Problem:   Acute endocarditis Active Problems:   CAD- remote "ruptured coronary"   PACEMAKER, PERMANENT   Obesity- BMI 42   Pacemaker infection   Chronic renal insufficiency, stage III (moderate)   Benign essential HTN   Obstructive apnea- C-pap intol  Subjective: Without complaints  Abtx:  Anti-infectives    Start     Dose/Rate Route Frequency Ordered Stop   10/11/14 2100  vancomycin (VANCOCIN) 1,250 mg in sodium chloride 0.9 % 250 mL IVPB     1,250 mg 166.7 mL/hr over 90 Minutes Intravenous Every 24 hours 10/11/14 1541     10/11/14 1800  cefTRIAXone (ROCEPHIN) 2 g in dextrose 5 % 50 mL IVPB - Premix     2 g 100 mL/hr over 30 Minutes Intravenous Every 24 hours 10/11/14 1403     10/11/14 1430  cefTRIAXone (ROCEPHIN) 2 g in dextrose 5 % 50 mL IVPB - Premix  Status:  Discontinued     2 g 100 mL/hr over 30 Minutes Intravenous Every 12 hours 10/11/14 1357 10/11/14 1403   10/11/14 1430  rifampin (RIFADIN) 300 mg in sodium chloride 0.9 % 100 mL IVPB     300 mg 200 mL/hr over 30 Minutes Intravenous Every 12 hours 10/11/14 1357        Medications:  Scheduled: . atenolol  100 mg Oral Daily  . cefTRIAXone (ROCEPHIN)  IV  2 g Intravenous Q24H  . docusate sodium  100 mg Oral BID  . famotidine  20 mg Oral BID  . furosemide  40 mg Oral Daily  . losartan  50 mg Oral Daily  . rifampin (RIFADIN) IVPB  300 mg Intravenous Q12H  . sodium chloride  3 mL Intravenous Q12H  . sodium chloride  3 mL Intravenous Q12H  . vancomycin  1,250 mg Intravenous Q24H    Objective: Vital signs in last 24 hours: Temp:  [97.7 F (36.5 C)-98.1 F (36.7 C)] 97.8 F (36.6 C) (05/23 0422) Pulse Rate:  [72-74] 74 (05/23 0422) Resp:  [18-20] 20 (05/23 0422) BP: (128-142)/(73-74) 140/74 mmHg (05/23 0422) SpO2:  [90 %-95 %] 91 % (05/23 0422) Weight:  [110.4 kg (243 lb 6.2 oz)] 110.4 kg (243 lb 6.2 oz) (05/23  0422)   General appearance: alert, cooperative and no distress Resp: clear to auscultation bilaterally Cardio: regular rate and rhythm GI: normal findings: bowel sounds normal and soft, non-tender  Lab Results  Recent Labs  10/12/14 0339 10/14/14 0430  WBC 7.5 6.8  HGB 15.7 15.9  HCT 47.4 46.4  NA 140 137  K 4.0 4.0  CL 102 103  CO2 27 21*  BUN 20 23*  CREATININE 1.86* 1.83*   Liver Panel  Recent Labs  10/12/14 0339  PROT 7.0  ALBUMIN 3.1*  AST 25  ALT 23  ALKPHOS 66  BILITOT 1.3*   Sedimentation Rate No results for input(s): ESRSEDRATE in the last 72 hours. C-Reactive Protein No results for input(s): CRP in the last 72 hours.  Microbiology: Recent Results (from the past 240 hour(s))  Culture, blood (routine x 2)     Status: None (Preliminary result)   Collection Time: 10/10/14  7:12 PM  Result Value Ref Range Status   Specimen Description BLOOD LEFT ARM  Final   Special Requests BOTTLES DRAWN AEROBIC ONLY .[facility]5CC  Final   Culture   Final  BLOOD CULTURE RECEIVED NO GROWTH TO DATE CULTURE WILL BE HELD FOR 5 DAYS BEFORE ISSUING A FINAL NEGATIVE REPORT Performed at [facility]    Report Status PENDING  Incomplete  Culture, blood (routine x 2)     Status: None (Preliminary result)   Collection Time: 10/10/14  7:19 PM  Result Value Ref Range Status   Specimen Description BLOOD RIGHT HAND  Final   Special Requests BOTTLES DRAWN AEROBIC ONLY 2 CC  Final   Culture   Final           BLOOD CULTURE RECEIVED NO GROWTH TO DATE CULTURE WILL BE HELD FOR 5 DAYS BEFORE ISSUING A FINAL NEGATIVE REPORT Performed at [facility]    Report Status PENDING  Incomplete  Culture, blood (routine x 2)     Status: None (Preliminary result)   Collection Time: 10/11/14  4:21 PM  Result Value Ref Range Status   Specimen Description BLOOD RIGHT ANTECUBITAL  Final   Special Requests BOTTLES DRAWN AEROBIC AND ANAEROBIC 10CC  Final   Culture   Final    GRAM  POSITIVE COCCI IN CLUSTERS Note: Gram Stain Report Called to,Read Back By and Verified With: J.JURIS 5.22.16  710PM BY MANGR Performed at [facility]    Report Status PENDING  Incomplete  Culture, blood (routine x 2)     Status: None (Preliminary result)   Collection Time: 10/11/14  4:40 PM  Result Value Ref Range Status   Specimen Description BLOOD RIGHT HAND  Final   Special Requests   Final    BOTTLES DRAWN AEROBIC AND ANAEROBIC 10CC AER 5CC ANA   Culture   Final    GRAM POSITIVE COCCI IN CLUSTERS Note: Gram Stain Report Called to,Read Back By and Verified With: Lyndee Leo RN 710P Performed at [facility]    Report Status PENDING  Incomplete  Culture, blood (routine x 2)     Status: None (Preliminary result)   Collection Time: 10/11/14  7:12 PM  Result Value Ref Range Status   Specimen Description BLOOD RIGHT HAND  Final   Special Requests BOTTLES DRAWN AEROBIC AND ANAEROBIC 5CC  Final   Culture   Final           BLOOD CULTURE RECEIVED NO GROWTH TO DATE CULTURE WILL BE HELD FOR 5 DAYS BEFORE ISSUING A FINAL NEGATIVE REPORT Performed at [facility]    Report Status PENDING  Incomplete    Studies/Results: No results found.   Assessment/Plan: Pacemaker Endocarditis  BCx 1/2 5-5 MSSE DM2 CAD Pulmonary emboli (? Date) Lung nodule (? Date) Coagulopathy  Total days of antibiotics: 4 vanco/ceftriaxone/rifampin     No change in anbx for now.  Await his repeat BCx Planning for pacer removal.        Johny Sax Infectious Diseases (pager) [phone] [email or web address] 10/14/2014, 10:15 AM  LOS: 4 days

## 2014-10-15 ENCOUNTER — Inpatient Hospital Stay (HOSPITAL_COMMUNITY): Payer: Medicare Other

## 2014-10-15 LAB — CBC
HCT: 48.5 % (ref 39.0–52.0)
Hemoglobin: 16.1 g/dL (ref 13.0–17.0)
MCH: 28 pg (ref 26.0–34.0)
MCHC: 33.2 g/dL (ref 30.0–36.0)
MCV: 84.5 fL (ref 78.0–100.0)
Platelets: 143 10*3/uL — ABNORMAL LOW (ref 150–400)
RBC: 5.74 MIL/uL (ref 4.22–5.81)
RDW: 14.5 % (ref 11.5–15.5)
WBC: 7.5 10*3/uL (ref 4.0–10.5)

## 2014-10-15 LAB — PROTIME-INR
INR: 1.42 (ref 0.00–1.49)
Prothrombin Time: 17.5 seconds — ABNORMAL HIGH (ref 11.6–15.2)

## 2014-10-15 LAB — PULMONARY FUNCTION TEST
DL/VA % pred: 98 %
DL/VA: 3.91 ml/min/mmHg/L
DLCO cor % pred: 77 %
DLCO cor: 16.77 ml/min/mmHg
DLCO unc % pred: 80 %
DLCO unc: 17.44 ml/min/mmHg
FEF 25-75 Post: 1.67 L/sec
FEF 25-75 Pre: 1.84 L/sec
FEF2575-%Change-Post: -9 %
FEF2575-%Pred-Post: 102 %
FEF2575-%Pred-Pre: 113 %
FEV1-%Change-Post: -1 %
FEV1-%Pred-Post: 96 %
FEV1-%Pred-Pre: 97 %
FEV1-Post: 2.09 L
FEV1-Pre: 2.12 L
FEV1FVC-%Change-Post: 2 %
FEV1FVC-%Pred-Pre: 104 %
FEV6-%Change-Post: -3 %
FEV6-%Pred-Post: 94 %
FEV6-%Pred-Pre: 98 %
FEV6-Post: 2.66 L
FEV6-Pre: 2.76 L
FEV6FVC-%Change-Post: 0 %
FEV6FVC-%Pred-Post: 107 %
FEV6FVC-%Pred-Pre: 108 %
FVC-%Change-Post: -3 %
FVC-%Pred-Post: 88 %
FVC-%Pred-Pre: 91 %
FVC-Post: 2.68 L
FVC-Pre: 2.78 L
Post FEV1/FVC ratio: 78 %
Post FEV6/FVC ratio: 99 %
Pre FEV1/FVC ratio: 76 %
Pre FEV6/FVC Ratio: 99 %
RV % pred: 97 %
RV: 2.02 L
TLC % pred: 93 %
TLC: 5.07 L

## 2014-10-15 LAB — ABO/RH: ABO/RH(D): A POS

## 2014-10-15 LAB — COMPREHENSIVE METABOLIC PANEL
ALT: 23 U/L (ref 17–63)
AST: 29 U/L (ref 15–41)
Albumin: 3.3 g/dL — ABNORMAL LOW (ref 3.5–5.0)
Alkaline Phosphatase: 75 U/L (ref 38–126)
Anion gap: 12 (ref 5–15)
BUN: 24 mg/dL — ABNORMAL HIGH (ref 6–20)
CO2: 22 mmol/L (ref 22–32)
Calcium: 9.3 mg/dL (ref 8.9–10.3)
Chloride: 103 mmol/L (ref 101–111)
Creatinine, Ser: 1.76 mg/dL — ABNORMAL HIGH (ref 0.61–1.24)
GFR calc Af Amer: 42 mL/min — ABNORMAL LOW (ref >60)
GFR calc non Af Amer: 37 mL/min — ABNORMAL LOW (ref >60)
Glucose, Bld: 150 mg/dL — ABNORMAL HIGH (ref 65–99)
Potassium: 4.3 mmol/L (ref 3.5–5.1)
Sodium: 137 mmol/L (ref 135–145)
Total Bilirubin: 1.2 mg/dL (ref 0.3–1.2)
Total Protein: 7 g/dL (ref 6.5–8.1)

## 2014-10-15 LAB — URINALYSIS, ROUTINE W REFLEX MICROSCOPIC
Bilirubin Urine: NEGATIVE
Glucose, UA: NEGATIVE mg/dL
Hgb urine dipstick: NEGATIVE
Ketones, ur: NEGATIVE mg/dL
Leukocytes, UA: NEGATIVE
Nitrite: NEGATIVE
Protein, ur: NEGATIVE mg/dL
Specific Gravity, Urine: 1.009 (ref 1.005–1.030)
Urobilinogen, UA: 0.2 mg/dL (ref 0.0–1.0)
pH: 5 (ref 5.0–8.0)

## 2014-10-15 LAB — BLOOD GAS, ARTERIAL
Acid-base deficit: 1.4 mmol/L (ref 0.0–2.0)
Bicarbonate: 22.4 mEq/L (ref 20.0–24.0)
Drawn by: 22766
FIO2: 0.21 %
O2 Saturation: 94.4 %
Patient temperature: 98.6
TCO2: 23.4 mmol/L (ref 0–100)
pCO2 arterial: 34.6 mmHg — ABNORMAL LOW (ref 35.0–45.0)
pH, Arterial: 7.427 (ref 7.350–7.450)
pO2, Arterial: 70.5 mmHg — ABNORMAL LOW (ref 80.0–100.0)

## 2014-10-15 LAB — TYPE AND SCREEN
ABO/RH(D): A POS
Antibody Screen: NEGATIVE

## 2014-10-15 LAB — HEPARIN LEVEL (UNFRACTIONATED)
Heparin Unfractionated: 0.4 IU/mL (ref 0.30–0.70)
Heparin Unfractionated: 0.6 IU/mL (ref 0.30–0.70)

## 2014-10-15 LAB — APTT: aPTT: 106 seconds — ABNORMAL HIGH (ref 24–37)

## 2014-10-15 MED ORDER — SODIUM CHLORIDE 0.9 % IV SOLN
INTRAVENOUS | Status: DC
  Filled 2014-10-15: qty 30

## 2014-10-15 MED ORDER — DEXTROSE 5 % IV SOLN
1.5000 g | INTRAVENOUS | Status: AC
  Administered 2014-10-16: 1.5 g via INTRAVENOUS
  Filled 2014-10-15: qty 1.5

## 2014-10-15 MED ORDER — EPINEPHRINE HCL 1 MG/ML IJ SOLN
0.0000 ug/min | INTRAVENOUS | Status: DC
  Filled 2014-10-15: qty 4

## 2014-10-15 MED ORDER — CHLORHEXIDINE GLUCONATE CLOTH 2 % EX PADS
6.0000 | MEDICATED_PAD | Freq: Once | CUTANEOUS | Status: AC
  Administered 2014-10-15: 6 via TOPICAL

## 2014-10-15 MED ORDER — SODIUM CHLORIDE 0.9 % IV SOLN
INTRAVENOUS | Status: DC
  Filled 2014-10-15: qty 40

## 2014-10-15 MED ORDER — DOPAMINE-DEXTROSE 3.2-5 MG/ML-% IV SOLN
0.0000 ug/kg/min | INTRAVENOUS | Status: AC
Start: 2014-10-16 — End: 2014-10-16
  Administered 2014-10-16: 3 ug/kg/min via INTRAVENOUS
  Filled 2014-10-15: qty 250

## 2014-10-15 MED ORDER — DEXTROSE 5 % IV SOLN
750.0000 mg | INTRAVENOUS | Status: DC
  Filled 2014-10-15: qty 750

## 2014-10-15 MED ORDER — PHENYLEPHRINE HCL 10 MG/ML IJ SOLN
30.0000 ug/min | INTRAMUSCULAR | Status: DC
Start: 2014-10-16 — End: 2014-10-16
  Filled 2014-10-15: qty 2

## 2014-10-15 MED ORDER — PLASMA-LYTE 148 IV SOLN
INTRAVENOUS | Status: AC
  Administered 2014-10-16: 500 mL
  Filled 2014-10-15: qty 2.5

## 2014-10-15 MED ORDER — POTASSIUM CHLORIDE 2 MEQ/ML IV SOLN
80.0000 meq | INTRAVENOUS | Status: DC
  Filled 2014-10-15: qty 40

## 2014-10-15 MED ORDER — ALBUTEROL SULFATE (2.5 MG/3ML) 0.083% IN NEBU
2.5000 mg | INHALATION_SOLUTION | Freq: Once | RESPIRATORY_TRACT | Status: AC
  Administered 2014-10-15: 2.5 mg via RESPIRATORY_TRACT

## 2014-10-15 MED ORDER — BISACODYL 5 MG PO TBEC
5.0000 mg | DELAYED_RELEASE_TABLET | Freq: Once | ORAL | Status: AC
  Administered 2014-10-15: 5 mg via ORAL
  Filled 2014-10-15: qty 1

## 2014-10-15 MED ORDER — MAGNESIUM SULFATE 50 % IJ SOLN
40.0000 meq | INTRAMUSCULAR | Status: DC
  Filled 2014-10-15: qty 10

## 2014-10-15 MED ORDER — SODIUM CHLORIDE 0.9 % IV SOLN
INTRAVENOUS | Status: DC
  Filled 2014-10-15: qty 2.5

## 2014-10-15 MED ORDER — TEMAZEPAM 15 MG PO CAPS: 15.0000 mg | ORAL_CAPSULE | Freq: Once | ORAL | Status: AC | PRN

## 2014-10-15 MED ORDER — NITROGLYCERIN IN D5W 200-5 MCG/ML-% IV SOLN
2.0000 ug/min | INTRAVENOUS | Status: DC
  Filled 2014-10-15: qty 250

## 2014-10-15 MED ORDER — DEXMEDETOMIDINE HCL IN NACL 400 MCG/100ML IV SOLN
0.1000 ug/kg/h | INTRAVENOUS | Status: DC
  Filled 2014-10-15: qty 100

## 2014-10-15 NOTE — Progress Notes (Signed)
Utilization review completed.  

## 2014-10-15 NOTE — Progress Notes (Signed)
ANTICOAGULATION CONSULT NOTE - Follow Up Consult  Pharmacy Consult for heparin Indication: pulmonary embolus   Labs:  Recent Labs  10/12/14 0339 10/13/14 0515 10/14/14 0430 10/15/14 0020  HGB 15.7  --  15.9  --   HCT 47.4  --  46.4  --   PLT 149*  --  145*  --   LABPROT 33.6* 26.9* 21.5*  --   INR 3.39* 2.52* 1.88*  --   HEPARINUNFRC  --   --   --  0.60  CREATININE 1.86*  --  1.83*  --      Assessment/Plan:  74yo male therapeutic on heparin with initial dosing for low INR while Coumadin being held. Will continue gtt at current rate and confirm stable with am labs.   Vernard GamblesVeronda Jacqueleen Pulver, PharmD, BCPS  10/15/2014,1:05 AM

## 2014-10-15 NOTE — Progress Notes (Signed)
ANTICOAGULATION CONSULT NOTE   Pharmacy Consult for heparin  Indication: recent PE  Allergies  Allergen Reactions  . Lovastatin Other (See Comments)    myalgias  . Lisinopril Itching    Patient Measurements: Height: 5' 3.5" (161.3 cm) Weight: 242 lb 8.1 oz (110 kg) IBW/kg (Calculated) : 58.05 Heparin Dosing Weight: 83kg  Vital Signs: Temp: 97.8 F (36.6 C) (05/24 0332) Temp Source: Oral (05/24 0332) BP: 119/77 mmHg (05/24 0332) Pulse Rate: 75 (05/24 0332)  Labs:  Recent Labs  10/13/14 0515 10/14/14 0430 10/15/14 0020 10/15/14 0421  HGB  --  15.9  --  16.1  HCT  --  46.4  --  48.5  PLT  --  145*  --  143*  LABPROT 26.9* 21.5*  --  17.5*  INR 2.52* 1.88*  --  1.42  HEPARINUNFRC  --   --  0.60 0.40  CREATININE  --  1.83*  --   --     Estimated Creatinine Clearance: 40.1 mL/min (by C-G formula based on Cr of 1.83).   Medical History: Past Medical History  Diagnosis Date  . 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 Overweight(278.02)   . Hyperlipemia   . CAD (coronary artery disease)     a. details unclear  . HTN (hypertension)   . Osteoarthritis   . GERD (gastroesophageal reflux disease)   . BPH (benign prostatic hyperplasia)   . OSA (obstructive sleep apnea)     a. on CPAP  . Type 2 diabetes mellitus   . COPD (chronic obstructive pulmonary disease)   . Renal impairment   . Chronic anxiety   . Obesity     Medications:  Scheduled:  . atenolol  100 mg Oral Daily  . cefTRIAXone (ROCEPHIN)  IV  2 g Intravenous Q24H  . docusate sodium  100 mg Oral BID  . famotidine  20 mg Oral BID  . furosemide  40 mg Oral Daily  . losartan  50 mg Oral Daily  . rifampin (RIFADIN) IVPB  300 mg Intravenous Q12H  . sodium chloride  3 mL Intravenous Q12H  . sodium chloride  3 mL Intravenous Q12H  . vancomycin  1,250 mg Intravenous Q24H    Assessment: 74 yo male with recent PE (~ 2 months ago) on coumadin PTA and now on hold for PPM extraction and possible  PFO closure. INR today is 1.42 and on heparin while coumadin is on hold. hg/hct= 16.1/48.5, plt= 143.  -Patient noted for OR on 5/25  Goal of Therapy:  Heparin level 0.3-0.7 units/ml Monitor platelets by anticoagulation protocol: Yes   Plan:  -Continue heparin at 1300 units/hr (~ 16 units/kg/hr) -Heparin level and CBC daily -Will follow anticoagulation plans post OR on 5/25  Harland Germanndrew Jerah Esty, Pharm D 10/15/2014 10:35 AM

## 2014-10-15 NOTE — Care Management Note (Addendum)
Case Management Note  Patient Details  Name: Harl FavorClyde Lorenzi MRN: [id number] Date of Birth: 1940/08/17  Subjective/Objective:       Pt admitted with endocarditis- plan for surgery on 10/16/14            Action/Plan: PTA pt lived at home - NCM to follow post op for any d/c needs and discharge plan  Expected Discharge Date:  10/21/14               Expected Discharge Plan:  Home w Home Health Services  In-House Referral:     Discharge planning Services  CM Consult  Post Acute Care Choice:    Choice offered to:     DME Arranged:    DME Agency:     HH Arranged:    HH Agency:     Status of Service:  In process, will continue to follow  Medicare Important Message Given:    Date Medicare IM Given:    Medicare IM give by:    Date Additional Medicare IM Given:    Additional Medicare Important Message give by:     If discussed at Long Length of Stay Meetings, dates discussed:  10/15/14  Additional Comments:  Darrold SpanWebster, Avondre Richens Hall, RN 10/15/2014, 11:51 AM
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
## 2014-10-15 NOTE — Progress Notes (Addendum)
      [address]       [name] [id number]             [phone]          Procedure(s) (LRB): REMOVAL FOREIGN BODY ON PUMP (Right) CANNULATION FOR CARDIOPULMONARY BYPASS (N/A) CLOSURE OF PATENT FORAMEN OVALE (N/A) PACEMAKER LEAD EXTRACTION (Right)  Subjective: Patient in good spirits, anxiously awaiting surgery.  Objective: Vital signs in last 24 hours: Temp:  [97.8 F (36.6 C)-98.2 F (36.8 C)] 97.8 F (36.6 C) (05/24 0332) Pulse Rate:  [68-80] 75 (05/24 0332) Cardiac Rhythm:  [-] Normal sinus rhythm (05/23 1900) Resp:  [20] 20 (05/24 0332) BP: (119-142)/(64-77) 119/77 mmHg (05/24 0332) SpO2:  [90 %-93 %] 92 % (05/24 0332) Weight:  [242 lb 8.1 oz (110 kg)] 242 lb 8.1 oz (110 kg) (05/24 0332)   Current Weight  10/15/14 242 lb 8.1 oz (110 kg)      Intake/Output from previous day: 05/23 0701 - 05/24 0700 In: 1501 [P.O.:960; I.V.:41; IV Piggyback:500] Out: 1250 [Urine:1250]   Physical Exam:  Cardiovascular: RRR Pulmonary: Clear to auscultation bilaterally; no rales, wheezes, or rhonchi. Abdomen: Soft, non tender, bowel sounds present. Extremities: No lower extremity edema.   Lab Results: CBC:  Recent Labs  10/14/14 0430 10/15/14 0421  WBC 6.8 7.5  HGB 15.9 16.1  HCT 46.4 48.5  PLT 145* 143*   BMET:   Recent Labs  10/14/14 0430  NA 137  K 4.0  CL 103  CO2 21*  GLUCOSE 113*  BUN 23*  CREATININE 1.83*  CALCIUM 8.7*    PT/INR:  Lab Results  Component Value Date   INR 1.42 10/15/2014   INR 1.88* 10/14/2014   INR 2.52* 10/13/2014   ABG:  INR: Will add last result for INR, ABG once components are confirmed Will add last 4 CBG results once components are confirmed  Assessment/Plan:  1. CV - Endocarditis.SR in the 60's. On Atenolol 100 mg daily and Cozaar 50 mg daily. INR decreased from 1.88 to 1.42.Plan is for OR in am for removal of PPM, PFO closeure. 2. ID-On Rocephin, Rifampin, and Vanco.   ZIMMERMAN,DONIELLE  MPA-C 10/15/2014,7:27 AM   Diet controlled type 2 DM primary care has not treated patient  Likely will need insulin postop ,  HgB A1c 7.2 this admission Plan lead extraction on pump tomorrow. The goals risks and alternatives of the planned surgical procedure  Lead extraction open on pump  have been discussed with the patient in detail. The risks of the procedure including death, infection, stroke, myocardial infarction, bleeding, blood transfusion renal failure  have all been discussed specifically.  I have quoted Harl Favorlyde Canepa a 3 % of perioperative mortality and a complication rate as high as  25 %. The patient's questions have been answered.Harl FavorClyde Spaziani is willing  to proceed with the planned procedure.  I have seen and examined Harl Favorlyde Noack and agree with the above assessment  and plan.  Delight OvensEdward B Rolla Servidio MD Beeper [phone] Office [phone] 10/15/2014 9:55 PM

## 2014-10-15 NOTE — Clinical Documentation Improvement (Signed)
"  Type 2 diabetes mellitus" is documented in the H&P and progress notes.  There is no treatment noted for Type 2 DM or FSGS checks during this admission.  There are no diabetic medications on the home medication list.  Please clarify if the diagnosis of Type 2 diabetes mellitus is applicable to this admission, including any associated treatment and monitoring.  Thank You, Jerral Ralphathy R Michaeal Davis ,RN Clinical Documentation Specialist:  [phone] [facility]- Health Information Management

## 2014-10-15 NOTE — Progress Notes (Signed)
INFECTIOUS DISEASE PROGRESS NOTE  ID: Kenneth [name] is a 74 y.o. male with  Principal Problem:   Acute endocarditis Active Problems:   CAD- remote "ruptured coronary"   PACEMAKER, PERMANENT   Obesity- BMI 42   Pacemaker infection   Chronic renal insufficiency, stage III (moderate)   Benign essential HTN   Obstructive apnea- C-pap intol  Subjective: Iv irritating him  Abtx:  Anti-infectives    Start     Dose/Rate Route Frequency Ordered Stop   10/11/14 2100  vancomycin (VANCOCIN) 1,250 mg in sodium chloride 0.9 % 250 mL IVPB     1,250 mg 166.7 mL/hr over 90 Minutes Intravenous Every 24 hours 10/11/14 1541     10/11/14 1800  cefTRIAXone (ROCEPHIN) 2 g in dextrose 5 % 50 mL IVPB - Premix     2 g 100 mL/hr over 30 Minutes Intravenous Every 24 hours 10/11/14 1403     10/11/14 1430  cefTRIAXone (ROCEPHIN) 2 g in dextrose 5 % 50 mL IVPB - Premix  Status:  Discontinued     2 g 100 mL/hr over 30 Minutes Intravenous Every 12 hours 10/11/14 1357 10/11/14 1403   10/11/14 1430  rifampin (RIFADIN) 300 mg in sodium chloride 0.9 % 100 mL IVPB     300 mg 200 mL/hr over 30 Minutes Intravenous Every 12 hours 10/11/14 1357        Medications:  Scheduled: . atenolol  100 mg Oral Daily  . docusate sodium  100 mg Oral BID  . famotidine  20 mg Oral BID  . furosemide  40 mg Oral Daily  . losartan  50 mg Oral Daily  . rifampin (RIFADIN) IVPB  300 mg Intravenous Q12H  . sodium chloride  3 mL Intravenous Q12H  . sodium chloride  3 mL Intravenous Q12H  . vancomycin  1,250 mg Intravenous Q24H    Objective: Vital signs in last 24 hours: Temp:  [97.8 F (36.6 C)-98.2 F (36.8 C)] 98.2 F (36.8 C) (05/24 1056) Pulse Rate:  [68-80] 76 (05/24 1056) Resp:  [20] 20 (05/24 1056) BP: (116-142)/(64-77) 116/74 mmHg (05/24 1056) SpO2:  [90 %-93 %] 91 % (05/24 1056) Weight:  [110 kg (242 lb 8.1 oz)] 110 kg (242 lb 8.1 oz) (05/24 0332)   General appearance: alert, cooperative and no  distress Resp: clear to auscultation bilaterally Chest wall: no tenderness Cardio: regular rate and rhythm GI: normal findings: bowel sounds normal and soft, non-tender  Lab Results  Recent Labs  10/14/14 0430 10/15/14 0421  WBC 6.8 7.5  HGB 15.9 16.1  HCT 46.4 48.5  NA 137  --   K 4.0  --   CL 103  --   CO2 21*  --   BUN 23*  --   CREATININE 1.83*  --    Liver Panel No results for input(s): PROT, ALBUMIN, AST, ALT, ALKPHOS, BILITOT, BILIDIR, IBILI in the last 72 hours. Sedimentation Rate No results for input(s): ESRSEDRATE in the last 72 hours. C-Reactive Protein No results for input(s): CRP in the last 72 hours.  Microbiology: Recent Results (from the past 240 hour(s))  Culture, blood (routine x 2)     Status: None (Preliminary result)   Collection Time: 10/10/14  7:12 PM  Result Value Ref Range Status   Specimen Description BLOOD LEFT ARM  Final   Special Requests BOTTLES DRAWN AEROBIC ONLY .5CC  Final   Culture   Final           BLOOD CULTURE RECEIVED  NO GROWTH TO DATE CULTURE WILL BE HELD FOR 5 DAYS BEFORE ISSUING A FINAL NEGATIVE REPORT Performed at [facility]    Report Status PENDING  Incomplete  Culture, blood (routine x 2)     Status: None (Preliminary result)   Collection Time: 10/10/14  7:19 PM  Result Value Ref Range Status   Specimen Description BLOOD RIGHT HAND  Final   Special Requests BOTTLES DRAWN AEROBIC ONLY 2 CC  Final   Culture   Final           BLOOD CULTURE RECEIVED NO GROWTH TO DATE CULTURE WILL BE HELD FOR 5 DAYS BEFORE ISSUING A FINAL NEGATIVE REPORT Performed at [facility]    Report Status PENDING  Incomplete  Culture, blood (routine x 2)     Status: None (Preliminary result)   Collection Time: 10/11/14  4:21 PM  Result Value Ref Range Status   Specimen Description BLOOD RIGHT ANTECUBITAL  Final   Special Requests BOTTLES DRAWN AEROBIC AND ANAEROBIC 10CC  Final   Culture   Final    STAPHYLOCOCCUS SPECIES  (COAGULASE NEGATIVE) Note: RIFAMPIN AND GENTAMICIN SHOULD NOT BE USED AS SINGLE DRUGS FOR TREATMENT OF STAPH INFECTIONS. Note: Gram Stain Report Called to,Read Back By and Verified With: J.JURIS 5.22.16  710PM BY MANGR Performed at [facility]    Report Status PENDING  Incomplete  Culture, blood (routine x 2)     Status: None (Preliminary result)   Collection Time: 10/11/14  4:40 PM  Result Value Ref Range Status   Specimen Description BLOOD RIGHT HAND  Final   Special Requests   Final    BOTTLES DRAWN AEROBIC AND ANAEROBIC 10CC AER 5CC ANA   Culture   Final    STAPHYLOCOCCUS SPECIES (COAGULASE NEGATIVE) Note: Gram Stain Report Called to,Read Back By and Verified With: Lyndee LeoJ. JURIS RN 710P Performed at [facility]    Report Status PENDING  Incomplete  Culture, blood (routine x 2)     Status: None (Preliminary result)   Collection Time: 10/11/14  7:12 PM  Result Value Ref Range Status   Specimen Description BLOOD RIGHT HAND  Final   Special Requests BOTTLES DRAWN AEROBIC AND ANAEROBIC 5CC  Final   Culture   Final           BLOOD CULTURE RECEIVED NO GROWTH TO DATE CULTURE WILL BE HELD FOR 5 DAYS BEFORE ISSUING A FINAL NEGATIVE REPORT Performed at [facility]    Report Status PENDING  Incomplete    Studies/Results: No results found.   Assessment/Plan: Pacemaker Endocarditis BCx 1/2 5-5 MSSE  BCx 2/3 5-20 CNS  BCx 2/2 5-19 (ngtd) DM2 CAD Recent Pulmonary emboli  Lung nodule  Coagulopathy  Total days of antibiotics: 5 vanco/ceftriaxone/rifampin He has 3/7 BCx showing CNS Will stop his ceftriaxone while we await ID of latest positive Cx. possble change to ancef thereafter.  ICD removal, unlikely to have replaced per EP note.  PIC after icd removed         Kenneth SaxJeffrey Djeneba [name] Infectious Diseases (pager) [phone] [email or web address] 10/15/2014, 11:50 AM  LOS: 5 days

## 2014-10-16 ENCOUNTER — Encounter (HOSPITAL_COMMUNITY)
Admission: AD | Disposition: A | Discharge status: Home Health | Payer: Medicare Other | Source: Ambulatory Visit | Attending: Cardiothoracic Surgery

## 2014-10-16 ENCOUNTER — Inpatient Hospital Stay (HOSPITAL_COMMUNITY): Payer: Medicare Other | Admitting: Certified Registered"

## 2014-10-16 ENCOUNTER — Encounter (HOSPITAL_COMMUNITY): Payer: Self-pay | Admitting: Certified Registered"

## 2014-10-16 ENCOUNTER — Inpatient Hospital Stay (HOSPITAL_COMMUNITY): Payer: Medicare Other

## 2014-10-16 DIAGNOSIS — T82897A Other specified complication of cardiac prosthetic devices, implants and grafts, initial encounter: Secondary | ICD-10-CM

## 2014-10-16 DIAGNOSIS — I38 Endocarditis, valve unspecified: Secondary | ICD-10-CM | POA: Diagnosis present

## 2014-10-16 HISTORY — PX: CANNULATION FOR CARDIOPULMONARY BYPASS: SHX6411

## 2014-10-16 HISTORY — PX: PACEMAKER LEAD REMOVAL: SHX5064

## 2014-10-16 HISTORY — PX: REPAIR OF PATENT FORAMEN OVALE: SHX6064

## 2014-10-16 LAB — POCT I-STAT 4, (NA,K, GLUC, HGB,HCT)
Glucose, Bld: 151 mg/dL — ABNORMAL HIGH (ref 65–99)
HCT: 46 % (ref 39.0–52.0)
Hemoglobin: 15.6 g/dL (ref 13.0–17.0)
Potassium: 4.1 mmol/L (ref 3.5–5.1)
Sodium: 138 mmol/L (ref 135–145)

## 2014-10-16 LAB — BASIC METABOLIC PANEL
Anion gap: 12 (ref 5–15)
BUN: 27 mg/dL — ABNORMAL HIGH (ref 6–20)
CO2: 23 mmol/L (ref 22–32)
CREATININE: 2.07 mg/dL — AB (ref 0.61–1.24)
Calcium: 9.8 mg/dL (ref 8.9–10.3)
Chloride: 102 mmol/L (ref 101–111)
GFR calc Af Amer: 35 mL/min — ABNORMAL LOW (ref >60)
GFR calc non Af Amer: 30 mL/min — ABNORMAL LOW (ref >60)
Glucose, Bld: 113 mg/dL — ABNORMAL HIGH (ref 65–99)
POTASSIUM: 4.5 mmol/L (ref 3.5–5.1)
SODIUM: 137 mmol/L (ref 135–145)

## 2014-10-16 LAB — POCT I-STAT, CHEM 8
BUN: 28 mg/dL — ABNORMAL HIGH (ref 6–20)
BUN: 24 mg/dL — ABNORMAL HIGH (ref 6–20)
BUN: 29 mg/dL — ABNORMAL HIGH (ref 6–20)
BUN: 28 mg/dL — ABNORMAL HIGH (ref 6–20)
BUN: 28 mg/dL — ABNORMAL HIGH (ref 6–20)
BUN: 25 mg/dL — ABNORMAL HIGH (ref 6–20)
Calcium, Ion: 1.28 mmol/L (ref 1.13–1.30)
Calcium, Ion: 0.93 mmol/L — ABNORMAL LOW (ref 1.13–1.30)
Calcium, Ion: 1.24 mmol/L (ref 1.13–1.30)
Calcium, Ion: 1.07 mmol/L — ABNORMAL LOW (ref 1.13–1.30)
Calcium, Ion: 1.07 mmol/L — ABNORMAL LOW (ref 1.13–1.30)
Calcium, Ion: 1.11 mmol/L — ABNORMAL LOW (ref 1.13–1.30)
Chloride: 104 mmol/L (ref 101–111)
Chloride: 104 mmol/L (ref 101–111)
Chloride: 99 mmol/L — ABNORMAL LOW (ref 101–111)
Chloride: 100 mmol/L — ABNORMAL LOW (ref 101–111)
Chloride: 100 mmol/L — ABNORMAL LOW (ref 101–111)
Chloride: 104 mmol/L (ref 101–111)
Creatinine, Ser: 2 mg/dL — ABNORMAL HIGH (ref 0.61–1.24)
Creatinine, Ser: 1.5 mg/dL — ABNORMAL HIGH (ref 0.61–1.24)
Creatinine, Ser: 2.1 mg/dL — ABNORMAL HIGH (ref 0.61–1.24)
Creatinine, Ser: 1.8 mg/dL — ABNORMAL HIGH (ref 0.61–1.24)
Creatinine, Ser: 1.9 mg/dL — ABNORMAL HIGH (ref 0.61–1.24)
Creatinine, Ser: 1.9 mg/dL — ABNORMAL HIGH (ref 0.61–1.24)
Glucose, Bld: 112 mg/dL — ABNORMAL HIGH (ref 65–99)
Glucose, Bld: 116 mg/dL — ABNORMAL HIGH (ref 65–99)
Glucose, Bld: 122 mg/dL — ABNORMAL HIGH (ref 65–99)
Glucose, Bld: 146 mg/dL — ABNORMAL HIGH (ref 65–99)
Glucose, Bld: 155 mg/dL — ABNORMAL HIGH (ref 65–99)
Glucose, Bld: 138 mg/dL — ABNORMAL HIGH (ref 65–99)
HCT: 47 % (ref 39.0–52.0)
HCT: 33 % — ABNORMAL LOW (ref 39.0–52.0)
HCT: 41 % (ref 39.0–52.0)
HCT: 32 % — ABNORMAL LOW (ref 39.0–52.0)
HCT: 32 % — ABNORMAL LOW (ref 39.0–52.0)
HCT: 43 % (ref 39.0–52.0)
Hemoglobin: 16 g/dL (ref 13.0–17.0)
Hemoglobin: 11.2 g/dL — ABNORMAL LOW (ref 13.0–17.0)
Hemoglobin: 13.9 g/dL (ref 13.0–17.0)
Hemoglobin: 10.9 g/dL — ABNORMAL LOW (ref 13.0–17.0)
Hemoglobin: 10.9 g/dL — ABNORMAL LOW (ref 13.0–17.0)
Hemoglobin: 14.6 g/dL (ref 13.0–17.0)
Potassium: 4.4 mmol/L (ref 3.5–5.1)
Potassium: 4.1 mmol/L (ref 3.5–5.1)
Potassium: 4.2 mmol/L (ref 3.5–5.1)
Potassium: 4.1 mmol/L (ref 3.5–5.1)
Potassium: 3.9 mmol/L (ref 3.5–5.1)
Potassium: 4.5 mmol/L (ref 3.5–5.1)
Sodium: 139 mmol/L (ref 135–145)
Sodium: 138 mmol/L (ref 135–145)
Sodium: 141 mmol/L (ref 135–145)
Sodium: 136 mmol/L (ref 135–145)
Sodium: 137 mmol/L (ref 135–145)
Sodium: 137 mmol/L (ref 135–145)
TCO2: 23 mmol/L (ref 0–100)
TCO2: 23 mmol/L (ref 0–100)
TCO2: 25 mmol/L (ref 0–100)
TCO2: 25 mmol/L (ref 0–100)
TCO2: 22 mmol/L (ref 0–100)
TCO2: 19 mmol/L (ref 0–100)

## 2014-10-16 LAB — POCT I-STAT 3, ART BLOOD GAS (G3+)
Acid-Base Excess: 2 mmol/L (ref 0.0–2.0)
Acid-base deficit: 4 mmol/L — ABNORMAL HIGH (ref 0.0–2.0)
Acid-base deficit: 1 mmol/L (ref 0.0–2.0)
Acid-base deficit: 4 mmol/L — ABNORMAL HIGH (ref 0.0–2.0)
Bicarbonate: 23.2 mEq/L (ref 20.0–24.0)
Bicarbonate: 27.6 mEq/L — ABNORMAL HIGH (ref 20.0–24.0)
Bicarbonate: 24.1 mEq/L — ABNORMAL HIGH (ref 20.0–24.0)
Bicarbonate: 23.5 mEq/L (ref 20.0–24.0)
O2 Saturation: 100 %
O2 Saturation: 100 %
O2 Saturation: 100 %
O2 Saturation: 91 %
Patient temperature: 36.3
TCO2: 25 mmol/L (ref 0–100)
TCO2: 29 mmol/L (ref 0–100)
TCO2: 25 mmol/L (ref 0–100)
TCO2: 25 mmol/L (ref 0–100)
pCO2 arterial: 47.9 mmHg — ABNORMAL HIGH (ref 35.0–45.0)
pCO2 arterial: 48.2 mmHg — ABNORMAL HIGH (ref 35.0–45.0)
pCO2 arterial: 42.6 mmHg (ref 35.0–45.0)
pCO2 arterial: 48.2 mmHg — ABNORMAL HIGH (ref 35.0–45.0)
pH, Arterial: 7.293 — ABNORMAL LOW (ref 7.350–7.450)
pH, Arterial: 7.366 (ref 7.350–7.450)
pH, Arterial: 7.361 (ref 7.350–7.450)
pH, Arterial: 7.291 — ABNORMAL LOW (ref 7.350–7.450)
pO2, Arterial: 223 mmHg — ABNORMAL HIGH (ref 80.0–100.0)
pO2, Arterial: 449 mmHg — ABNORMAL HIGH (ref 80.0–100.0)
pO2, Arterial: 197 mmHg — ABNORMAL HIGH (ref 80.0–100.0)
pO2, Arterial: 65 mmHg — ABNORMAL LOW (ref 80.0–100.0)

## 2014-10-16 LAB — CBC
HCT: 48 % (ref 39.0–52.0)
HCT: 45.2 % (ref 39.0–52.0)
HCT: 41.3 % (ref 39.0–52.0)
Hemoglobin: 16.2 g/dL (ref 13.0–17.0)
Hemoglobin: 15.5 g/dL (ref 13.0–17.0)
Hemoglobin: 13.8 g/dL (ref 13.0–17.0)
MCH: 28.7 pg (ref 26.0–34.0)
MCH: 29.1 pg (ref 26.0–34.0)
MCH: 28.3 pg (ref 26.0–34.0)
MCHC: 33.8 g/dL (ref 30.0–36.0)
MCHC: 34.3 g/dL (ref 30.0–36.0)
MCHC: 33.4 g/dL (ref 30.0–36.0)
MCV: 85.1 fL (ref 78.0–100.0)
MCV: 84.8 fL (ref 78.0–100.0)
MCV: 84.6 fL (ref 78.0–100.0)
Platelets: 176 10*3/uL (ref 150–400)
Platelets: 101 10*3/uL — ABNORMAL LOW (ref 150–400)
Platelets: 109 10*3/uL — ABNORMAL LOW (ref 150–400)
RBC: 5.64 MIL/uL (ref 4.22–5.81)
RBC: 5.33 MIL/uL (ref 4.22–5.81)
RBC: 4.88 MIL/uL (ref 4.22–5.81)
RDW: 14.7 % (ref 11.5–15.5)
RDW: 14.4 % (ref 11.5–15.5)
RDW: 14.4 % (ref 11.5–15.5)
WBC: 8.5 10*3/uL (ref 4.0–10.5)
WBC: 19.3 10*3/uL — AB (ref 4.0–10.5)
WBC: 14.4 10*3/uL — ABNORMAL HIGH (ref 4.0–10.5)

## 2014-10-16 LAB — PROTIME-INR
INR: 1.3 (ref 0.00–1.49)
INR: 1.81 — ABNORMAL HIGH (ref 0.00–1.49)
Prothrombin Time: 16.3 seconds — ABNORMAL HIGH (ref 11.6–15.2)
Prothrombin Time: 21 seconds — ABNORMAL HIGH (ref 11.6–15.2)

## 2014-10-16 LAB — CULTURE, BLOOD (ROUTINE X 2)

## 2014-10-16 LAB — HEMOGLOBIN AND HEMATOCRIT, BLOOD
HCT: 32.1 % — ABNORMAL LOW (ref 39.0–52.0)
Hemoglobin: 10.7 g/dL — ABNORMAL LOW (ref 13.0–17.0)

## 2014-10-16 LAB — SURGICAL PCR SCREEN
MRSA, PCR: NEGATIVE
Staphylococcus aureus: NEGATIVE

## 2014-10-16 LAB — POCT I-STAT GLUCOSE
Glucose, Bld: 114 mg/dL — ABNORMAL HIGH (ref 65–99)
Glucose, Bld: 111 mg/dL — ABNORMAL HIGH (ref 65–99)
Operator id: 156951
Operator id: 156951

## 2014-10-16 LAB — HEPARIN LEVEL (UNFRACTIONATED): Heparin Unfractionated: 0.86 IU/mL — ABNORMAL HIGH (ref 0.30–0.70)

## 2014-10-16 LAB — HEMOGLOBIN A1C
Hgb A1c MFr Bld: 7.1 % — ABNORMAL HIGH (ref 4.8–5.6)
Mean Plasma Glucose: 157 mg/dL

## 2014-10-16 LAB — MAGNESIUM: Magnesium: 2.6 mg/dL — ABNORMAL HIGH (ref 1.7–2.4)

## 2014-10-16 LAB — CREATININE, SERUM
Creatinine, Ser: 2.1 mg/dL — ABNORMAL HIGH (ref 0.61–1.24)
GFR calc Af Amer: 34 mL/min — ABNORMAL LOW (ref >60)
GFR calc non Af Amer: 30 mL/min — ABNORMAL LOW (ref >60)

## 2014-10-16 LAB — GLUCOSE, CAPILLARY: Glucose-Capillary: 112 mg/dL — ABNORMAL HIGH (ref 65–99)

## 2014-10-16 LAB — APTT: aPTT: 53 seconds — ABNORMAL HIGH (ref 24–37)

## 2014-10-16 LAB — PLATELET COUNT: Platelets: 101 10*3/uL — ABNORMAL LOW (ref 150–400)

## 2014-10-16 SURGERY — REMOVAL, ELECTRODE LEAD, CARDIAC PACEMAKER, WITHOUT REPLACEMENT
Anesthesia: General | Site: Chest | Laterality: Right

## 2014-10-16 MED ORDER — HEPARIN SODIUM (PORCINE) 1000 UNIT/ML IJ SOLN
INTRAMUSCULAR | Status: AC
  Filled 2014-10-16: qty 1

## 2014-10-16 MED ORDER — MIDAZOLAM HCL 10 MG/2ML IJ SOLN
INTRAMUSCULAR | Status: AC
  Filled 2014-10-16: qty 2

## 2014-10-16 MED ORDER — TRAMADOL HCL 50 MG PO TABS: 50.0000 mg | ORAL_TABLET | ORAL | Status: DC | PRN

## 2014-10-16 MED ORDER — DEXMEDETOMIDINE HCL IN NACL 200 MCG/50ML IV SOLN
0.0000 ug/kg/h | INTRAVENOUS | Status: DC
  Administered 2014-10-16: 0.3 ug/kg/h via INTRAVENOUS

## 2014-10-16 MED ORDER — BISACODYL 10 MG RE SUPP: 10.0000 mg | Freq: Every day | RECTAL | Status: DC

## 2014-10-16 MED ORDER — ROCURONIUM BROMIDE 100 MG/10ML IV SOLN
INTRAVENOUS | Status: DC | PRN
  Administered 2014-10-16: 50 mg via INTRAVENOUS

## 2014-10-16 MED ORDER — MIDAZOLAM HCL 2 MG/2ML IJ SOLN: 2.0000 mg | INTRAMUSCULAR | Status: DC | PRN

## 2014-10-16 MED ORDER — PROPOFOL 10 MG/ML IV BOLUS
INTRAVENOUS | Status: AC
  Filled 2014-10-16: qty 20

## 2014-10-16 MED ORDER — SODIUM CHLORIDE 0.9 % IV SOLN
300.0000 mg | INTRAVENOUS | Status: DC
  Filled 2014-10-16: qty 300

## 2014-10-16 MED ORDER — PHENYLEPHRINE HCL 10 MG/ML IJ SOLN
20.0000 mg | INTRAVENOUS | Status: DC | PRN
  Administered 2014-10-16: 100 ug/min via INTRAVENOUS

## 2014-10-16 MED ORDER — ONDANSETRON HCL 4 MG/2ML IJ SOLN: 4.0000 mg | Freq: Four times a day (QID) | INTRAMUSCULAR | Status: DC | PRN

## 2014-10-16 MED ORDER — VECURONIUM BROMIDE 10 MG IV SOLR
INTRAVENOUS | Status: AC
  Filled 2014-10-16: qty 10

## 2014-10-16 MED ORDER — HEPARIN SODIUM (PORCINE) 1000 UNIT/ML IJ SOLN
INTRAMUSCULAR | Status: DC | PRN
  Administered 2014-10-16: 33000 [IU] via INTRAVENOUS

## 2014-10-16 MED ORDER — ACETAMINOPHEN 160 MG/5ML PO SOLN
1000.0000 mg | Freq: Four times a day (QID) | ORAL | Status: DC
  Filled 2014-10-16: qty 40

## 2014-10-16 MED ORDER — ACETAMINOPHEN 500 MG PO TABS
1000.0000 mg | ORAL_TABLET | Freq: Four times a day (QID) | ORAL | Status: DC
  Administered 2014-10-17 – 2014-10-18 (×5): 1000 mg via ORAL
  Filled 2014-10-16 (×9): qty 2

## 2014-10-16 MED ORDER — HEMOSTATIC AGENTS (NO CHARGE) OPTIME
TOPICAL | Status: DC | PRN
  Administered 2014-10-16: 1 via TOPICAL

## 2014-10-16 MED ORDER — DEXMEDETOMIDINE HCL IN NACL 200 MCG/50ML IV SOLN
INTRAVENOUS | Status: DC | PRN
  Administered 2014-10-16: .3 ug/kg/h via INTRAVENOUS

## 2014-10-16 MED ORDER — INSULIN REGULAR BOLUS VIA INFUSION
0.0000 [IU] | Freq: Three times a day (TID) | INTRAVENOUS | Status: DC
  Filled 2014-10-16: qty 10

## 2014-10-16 MED ORDER — ROCURONIUM BROMIDE 50 MG/5ML IV SOLN
INTRAVENOUS | Status: AC
  Filled 2014-10-16: qty 1

## 2014-10-16 MED ORDER — FAMOTIDINE 20 MG PO TABS
20.0000 mg | ORAL_TABLET | Freq: Two times a day (BID) | ORAL | Status: DC
  Administered 2014-10-17 – 2014-10-22 (×11): 20 mg via ORAL
  Filled 2014-10-16 (×13): qty 1

## 2014-10-16 MED ORDER — SODIUM CHLORIDE 0.9 % IV SOLN
INTRAVENOUS | Status: DC
  Administered 2014-10-16: 20 mL/h via INTRAVENOUS

## 2014-10-16 MED ORDER — LACTATED RINGERS IV SOLN: 500.0000 mL | Freq: Once | INTRAVENOUS | Status: AC | PRN

## 2014-10-16 MED ORDER — LACTATED RINGERS IV SOLN
INTRAVENOUS | Status: DC
  Administered 2014-10-16: 20 mL/h via INTRAVENOUS

## 2014-10-16 MED ORDER — BISACODYL 5 MG PO TBEC
10.0000 mg | DELAYED_RELEASE_TABLET | Freq: Every day | ORAL | Status: DC
  Administered 2014-10-17 – 2014-10-19 (×3): 10 mg via ORAL
  Filled 2014-10-16 (×3): qty 2

## 2014-10-16 MED ORDER — MIDAZOLAM HCL 5 MG/5ML IJ SOLN
INTRAMUSCULAR | Status: DC | PRN
  Administered 2014-10-16: 2 mg via INTRAVENOUS
  Administered 2014-10-16: 1 mg via INTRAVENOUS
  Administered 2014-10-16: 2 mg via INTRAVENOUS
  Administered 2014-10-16: 3 mg via INTRAVENOUS

## 2014-10-16 MED ORDER — NITROGLYCERIN IN D5W 200-5 MCG/ML-% IV SOLN
INTRAVENOUS | Status: DC | PRN
  Administered 2014-10-16: 5 ug/min via INTRAVENOUS

## 2014-10-16 MED ORDER — METOPROLOL TARTRATE 1 MG/ML IV SOLN: 2.5000 mg | INTRAVENOUS | Status: DC | PRN

## 2014-10-16 MED ORDER — LACTATED RINGERS IV SOLN
INTRAVENOUS | Status: DC | PRN
  Administered 2014-10-16: 08:00:00 via INTRAVENOUS

## 2014-10-16 MED ORDER — MAGNESIUM SULFATE 4 GM/100ML IV SOLN
4.0000 g | Freq: Once | INTRAVENOUS | Status: AC
  Administered 2014-10-16: 4 g via INTRAVENOUS

## 2014-10-16 MED ORDER — SODIUM CHLORIDE 0.9 % IV SOLN
INTRAVENOUS | Status: DC
  Administered 2014-10-16: 1.8 [IU]/h via INTRAVENOUS
  Filled 2014-10-16: qty 2.5

## 2014-10-16 MED ORDER — SODIUM CHLORIDE 0.9 % IJ SOLN
3.0000 mL | Freq: Two times a day (BID) | INTRAMUSCULAR | Status: DC
  Administered 2014-10-17 – 2014-10-18 (×2): 3 mL via INTRAVENOUS

## 2014-10-16 MED ORDER — FAMOTIDINE IN NACL 20-0.9 MG/50ML-% IV SOLN
20.0000 mg | Freq: Two times a day (BID) | INTRAVENOUS | Status: AC
  Administered 2014-10-16 (×2): 20 mg via INTRAVENOUS
  Filled 2014-10-16: qty 50

## 2014-10-16 MED ORDER — ACETAMINOPHEN 160 MG/5ML PO SOLN: 650.0000 mg | Freq: Once | ORAL | Status: AC

## 2014-10-16 MED ORDER — 0.9 % SODIUM CHLORIDE (POUR BTL) OPTIME
TOPICAL | Status: DC | PRN
  Administered 2014-10-16: 1000 mL

## 2014-10-16 MED ORDER — SUCCINYLCHOLINE CHLORIDE 20 MG/ML IJ SOLN
INTRAMUSCULAR | Status: DC | PRN
  Administered 2014-10-16: 130 mg via INTRAVENOUS

## 2014-10-16 MED ORDER — SODIUM CHLORIDE 0.9 % IV SOLN: 250.0000 mL | INTRAVENOUS | Status: DC

## 2014-10-16 MED ORDER — MORPHINE SULFATE 2 MG/ML IJ SOLN
2.0000 mg | INTRAMUSCULAR | Status: DC | PRN
  Administered 2014-10-17 – 2014-10-18 (×7): 2 mg via INTRAVENOUS
  Filled 2014-10-16 (×5): qty 1
  Filled 2014-10-16: qty 2

## 2014-10-16 MED ORDER — FUROSEMIDE 10 MG/ML IJ SOLN
INTRAMUSCULAR | Status: AC
  Filled 2014-10-16: qty 8

## 2014-10-16 MED ORDER — CETYLPYRIDINIUM CHLORIDE 0.05 % MT LIQD
7.0000 mL | Freq: Four times a day (QID) | OROMUCOSAL | Status: DC
  Administered 2014-10-17 – 2014-10-18 (×7): 7 mL via OROMUCOSAL

## 2014-10-16 MED ORDER — INSULIN REGULAR HUMAN 100 UNIT/ML IJ SOLN
250.0000 [IU] | INTRAMUSCULAR | Status: DC | PRN
  Administered 2014-10-16: 1 [IU]/h via INTRAVENOUS

## 2014-10-16 MED ORDER — DOPAMINE-DEXTROSE 3.2-5 MG/ML-% IV SOLN
2.5000 ug/kg/min | INTRAVENOUS | Status: DC
  Administered 2014-10-16: 2.5 ug/kg/min via INTRAVENOUS

## 2014-10-16 MED ORDER — SUFENTANIL CITRATE 250 MCG/5ML IV SOLN
INTRAVENOUS | Status: AC
  Filled 2014-10-16: qty 5

## 2014-10-16 MED ORDER — ARTIFICIAL TEARS OP OINT
TOPICAL_OINTMENT | OPHTHALMIC | Status: AC
  Filled 2014-10-16: qty 3.5

## 2014-10-16 MED ORDER — METOPROLOL TARTRATE 25 MG/10 ML ORAL SUSPENSION
12.5000 mg | Freq: Two times a day (BID) | ORAL | Status: DC
  Filled 2014-10-16 (×5): qty 5

## 2014-10-16 MED ORDER — SODIUM CHLORIDE 0.9 % IV SOLN
10.0000 g | INTRAVENOUS | Status: DC | PRN
  Administered 2014-10-16: 5 g/h via INTRAVENOUS

## 2014-10-16 MED ORDER — ACETAMINOPHEN 650 MG RE SUPP
650.0000 mg | Freq: Once | RECTAL | Status: AC
  Administered 2014-10-16: 650 mg via RECTAL

## 2014-10-16 MED ORDER — ARTIFICIAL TEARS OP OINT
TOPICAL_OINTMENT | OPHTHALMIC | Status: DC | PRN
  Administered 2014-10-16: 1 via OPHTHALMIC

## 2014-10-16 MED ORDER — PROPOFOL 10 MG/ML IV BOLUS
INTRAVENOUS | Status: DC | PRN
  Administered 2014-10-16: 75 mg via INTRAVENOUS

## 2014-10-16 MED ORDER — LACTATED RINGERS IV SOLN: INTRAVENOUS | Status: DC

## 2014-10-16 MED ORDER — OXYCODONE HCL 5 MG PO TABS
5.0000 mg | ORAL_TABLET | ORAL | Status: DC | PRN
  Administered 2014-10-17 – 2014-10-19 (×6): 10 mg via ORAL
  Filled 2014-10-16 (×6): qty 2

## 2014-10-16 MED ORDER — ASPIRIN EC 325 MG PO TBEC
325.0000 mg | DELAYED_RELEASE_TABLET | Freq: Every day | ORAL | Status: DC
  Administered 2014-10-17 – 2014-10-22 (×6): 325 mg via ORAL
  Filled 2014-10-16 (×6): qty 1

## 2014-10-16 MED ORDER — MORPHINE SULFATE 2 MG/ML IJ SOLN: 1.0000 mg | INTRAMUSCULAR | Status: AC | PRN

## 2014-10-16 MED ORDER — LACTATED RINGERS IV SOLN
INTRAVENOUS | Status: DC | PRN
  Administered 2014-10-16 (×2): via INTRAVENOUS

## 2014-10-16 MED ORDER — DEXTROSE 5 % IV SOLN
0.0000 ug/min | INTRAVENOUS | Status: DC
  Administered 2014-10-16: 20 ug/min via INTRAVENOUS
  Filled 2014-10-16: qty 2

## 2014-10-16 MED ORDER — PROTAMINE SULFATE 10 MG/ML IV SOLN
INTRAVENOUS | Status: AC
  Filled 2014-10-16: qty 5

## 2014-10-16 MED ORDER — VECURONIUM BROMIDE 10 MG IV SOLR
INTRAVENOUS | Status: DC | PRN
  Administered 2014-10-16 (×3): 2 mg via INTRAVENOUS
  Administered 2014-10-16: 4 mg via INTRAVENOUS
  Administered 2014-10-16: 2 mg via INTRAVENOUS

## 2014-10-16 MED ORDER — ALBUMIN HUMAN 5 % IV SOLN
250.0000 mL | INTRAVENOUS | Status: AC | PRN
  Administered 2014-10-16: 250 mL via INTRAVENOUS

## 2014-10-16 MED ORDER — SUCCINYLCHOLINE CHLORIDE 20 MG/ML IJ SOLN
INTRAMUSCULAR | Status: AC
  Filled 2014-10-16: qty 1

## 2014-10-16 MED ORDER — METOPROLOL TARTRATE 12.5 MG HALF TABLET
12.5000 mg | ORAL_TABLET | Freq: Two times a day (BID) | ORAL | Status: DC
  Administered 2014-10-17 – 2014-10-22 (×10): 12.5 mg via ORAL
  Filled 2014-10-16 (×14): qty 1

## 2014-10-16 MED ORDER — HEMOSTATIC AGENTS (NO CHARGE) OPTIME
TOPICAL | Status: DC | PRN
  Administered 2014-10-16 (×3): 1 via TOPICAL

## 2014-10-16 MED ORDER — POTASSIUM CHLORIDE 10 MEQ/50ML IV SOLN: 10.0000 meq | INTRAVENOUS | Status: AC

## 2014-10-16 MED ORDER — DOCUSATE SODIUM 100 MG PO CAPS
200.0000 mg | ORAL_CAPSULE | Freq: Every day | ORAL | Status: DC
  Administered 2014-10-17 – 2014-10-19 (×3): 200 mg via ORAL
  Filled 2014-10-16 (×4): qty 2

## 2014-10-16 MED ORDER — LACTATED RINGERS IV SOLN
INTRAVENOUS | Status: DC | PRN
  Administered 2014-10-16 (×2): via INTRAVENOUS

## 2014-10-16 MED ORDER — PROTAMINE SULFATE 10 MG/ML IV SOLN
INTRAVENOUS | Status: DC | PRN
  Administered 2014-10-16 (×2): 150 mg via INTRAVENOUS

## 2014-10-16 MED ORDER — SODIUM CHLORIDE 0.9 % IJ SOLN: 3.0000 mL | INTRAMUSCULAR | Status: DC | PRN

## 2014-10-16 MED ORDER — NITROGLYCERIN IN D5W 200-5 MCG/ML-% IV SOLN: 0.0000 ug/min | INTRAVENOUS | Status: DC

## 2014-10-16 MED ORDER — CHLORHEXIDINE GLUCONATE 0.12 % MT SOLN
15.0000 mL | Freq: Two times a day (BID) | OROMUCOSAL | Status: DC
  Administered 2014-10-16 – 2014-10-18 (×4): 15 mL via OROMUCOSAL
  Filled 2014-10-16 (×6): qty 15

## 2014-10-16 MED ORDER — SUFENTANIL CITRATE 50 MCG/ML IV SOLN
INTRAVENOUS | Status: DC | PRN
  Administered 2014-10-16: 50 ug via INTRAVENOUS
  Administered 2014-10-16: 10 ug via INTRAVENOUS
  Administered 2014-10-16: 20 ug via INTRAVENOUS
  Administered 2014-10-16: 30 ug via INTRAVENOUS
  Administered 2014-10-16: 5 ug via INTRAVENOUS
  Administered 2014-10-16: 15 ug via INTRAVENOUS

## 2014-10-16 MED ORDER — ASPIRIN 81 MG PO CHEW: 324.0000 mg | CHEWABLE_TABLET | Freq: Every day | ORAL | Status: DC

## 2014-10-16 MED ORDER — SODIUM CHLORIDE 0.45 % IV SOLN
INTRAVENOUS | Status: DC | PRN
  Administered 2014-10-16: 20 mL/h via INTRAVENOUS
  Administered 2014-10-17: 19:00:00 via INTRAVENOUS

## 2014-10-16 MED FILL — Heparin Sodium (Porcine) Inj 1000 Unit/ML: INTRAMUSCULAR | Qty: 10 | Status: AC

## 2014-10-16 MED FILL — Mannitol IV Soln 20%: INTRAVENOUS | Qty: 500 | Status: AC

## 2014-10-16 MED FILL — Lidocaine HCl IV Inj 20 MG/ML: INTRAVENOUS | Qty: 5 | Status: AC

## 2014-10-16 MED FILL — Sodium Bicarbonate IV Soln 8.4%: INTRAVENOUS | Qty: 50 | Status: AC

## 2014-10-16 MED FILL — Sodium Chloride IV Soln 0.9%: INTRAVENOUS | Qty: 2000 | Status: AC

## 2014-10-16 MED FILL — Electrolyte-R (PH 7.4) Solution: INTRAVENOUS | Qty: 3000 | Status: AC

## 2014-10-16 SURGICAL SUPPLY — 88 items
BAG BANDED W/RUBBER/TAPE 36X54 (MISCELLANEOUS) IMPLANT
BLADE 10 SAFETY STRL DISP (BLADE) IMPLANT
BLADE OSCILLATING /SAGITTAL (BLADE) IMPLANT
BLADE STERNUM SYSTEM 6 (BLADE) ×4 IMPLANT
BNDG COHESIVE 4X5 WHT NS (GAUZE/BANDAGES/DRESSINGS) IMPLANT
CANISTER SUCTION 2500CC (MISCELLANEOUS) ×4 IMPLANT
CANN PRFSN 3/8X14X24FR PCFC (MISCELLANEOUS) ×3
CANN PRFSN 3/8XCNCT ST RT ANG (MISCELLANEOUS) ×3
CANNULA PRFSN 3/8X14X24FR PCFC (MISCELLANEOUS) ×3 IMPLANT
CANNULA PRFSN 3/8XCNCT RT ANG (MISCELLANEOUS) ×3 IMPLANT
CANNULA SUMP PERICARDIAL (CANNULA) ×4 IMPLANT
CANNULA VEN MTL TIP RT (MISCELLANEOUS) ×2
CATH CPB KIT GERHARDT (MISCELLANEOUS) ×4 IMPLANT
COIL ONE TIE COMPRESSION (MISCELLANEOUS) ×8 IMPLANT
CONN ST 1/4X3/8  BEN (MISCELLANEOUS) ×1
CONN ST 1/4X3/8 BEN (MISCELLANEOUS) ×3 IMPLANT
CONT SPEC 4OZ CLIKSEAL STRL BL (MISCELLANEOUS) ×8 IMPLANT
COVER TABLE BACK 60X90 (DRAPES) IMPLANT
DRAIN CHANNEL 28F RND 3/8 FF (WOUND CARE) ×8 IMPLANT
DRAPE CARDIOVASCULAR INCISE (DRAPES) ×1
DRAPE INCISE IOBAN 85X60 (DRAPES) ×4 IMPLANT
DRAPE SRG 135X102X78XABS (DRAPES) ×3 IMPLANT
DRSG AQUACEL AG ADV 3.5X14 (GAUZE/BANDAGES/DRESSINGS) ×4 IMPLANT
DRSG OPSITE 6X11 MED (GAUZE/BANDAGES/DRESSINGS) IMPLANT
ELECT REM PT RETURN 9FT ADLT (ELECTROSURGICAL) ×8
ELECTRODE REM PT RTRN 9FT ADLT (ELECTROSURGICAL) ×6 IMPLANT
FILTER STRAW FLUID ASPIR (MISCELLANEOUS) IMPLANT
FORCEPS BIOP RJ4 1.8 (CUTTING FORCEPS) IMPLANT
FORCEPS RADIAL JAW LRG 4 PULM (INSTRUMENTS) IMPLANT
GAUZE SPONGE 4X4 12PLY STRL (GAUZE/BANDAGES/DRESSINGS) ×4 IMPLANT
GAUZE SPONGE 4X4 16PLY XRAY LF (GAUZE/BANDAGES/DRESSINGS) IMPLANT
GLOVE BIO SURGEON STRL SZ 6.5 (GLOVE) ×12 IMPLANT
GLOVE BIOGEL M 8.0 STRL (GLOVE) ×4 IMPLANT
GLOVE BIOGEL PI IND STRL 6 (GLOVE) ×6 IMPLANT
GLOVE BIOGEL PI IND STRL 7.0 (GLOVE) ×21 IMPLANT
GLOVE BIOGEL PI IND STRL 7.5 (GLOVE) IMPLANT
GLOVE BIOGEL PI INDICATOR 6 (GLOVE) ×2
GLOVE BIOGEL PI INDICATOR 7.0 (GLOVE) ×7
GLOVE BIOGEL PI INDICATOR 7.5 (GLOVE)
GLOVE ECLIPSE 8.0 STRL XLNG CF (GLOVE) ×8 IMPLANT
GOWN STRL REUS W/ TWL LRG LVL3 (GOWN DISPOSABLE) ×21 IMPLANT
GOWN STRL REUS W/ TWL XL LVL3 (GOWN DISPOSABLE) ×6 IMPLANT
GOWN STRL REUS W/TWL LRG LVL3 (GOWN DISPOSABLE) ×7
GOWN STRL REUS W/TWL XL LVL3 (GOWN DISPOSABLE) ×2
KIT ROOM TURNOVER OR (KITS) ×4 IMPLANT
LINE VENT (MISCELLANEOUS) ×4 IMPLANT
LOOP VESSEL SUPERMAXI WHITE (MISCELLANEOUS) ×4 IMPLANT
MARKER SKIN DUAL TIP RULER LAB (MISCELLANEOUS) IMPLANT
NS IRRIG 1000ML POUR BTL (IV SOLUTION) ×24 IMPLANT
OIL SILICONE PENTAX (PARTS (SERVICE/REPAIRS)) IMPLANT
PACK OPEN HEART (CUSTOM PROCEDURE TRAY) ×4 IMPLANT
PACK TRANSFER 300ML (TOM) (MISCELLANEOUS) ×12 IMPLANT
PAD ARMBOARD 7.5X6 YLW CONV (MISCELLANEOUS) ×16 IMPLANT
PAD ELECT DEFIB RADIOL ZOLL (MISCELLANEOUS) ×4 IMPLANT
RADIAL JAW LRG 4 PULMONARY (INSTRUMENTS)
SET CARDIOPLEGIA MPS 5001102 (MISCELLANEOUS) ×4 IMPLANT
SET Y EXTENSION LINE CSP (IV SETS) ×4 IMPLANT
SHEATH EVOLUTION RL 11F (SHEATH) ×4 IMPLANT
SHEATH EVOLUTION SHORTE RL 11F (SHEATH) ×4 IMPLANT
SPONGE GAUZE 4X4 12PLY STER LF (GAUZE/BANDAGES/DRESSINGS) ×4 IMPLANT
STOPCOCK 4 WAY LG BORE MALE ST (IV SETS) ×4 IMPLANT
STOPCOCK MORSE 400PSI 3WAY (MISCELLANEOUS) ×4 IMPLANT
STYLET LIBERATOR LOCKING (MISCELLANEOUS) ×12 IMPLANT
SUT PROLENE 2 0 SH DA (SUTURE) IMPLANT
SUT PROLENE 4 0 RB 1 (SUTURE) ×3
SUT PROLENE 4-0 RB1 .5 CRCL 36 (SUTURE) ×9 IMPLANT
SUT STEEL 6MS V (SUTURE) ×4 IMPLANT
SUT STEEL SZ 6 DBL 3X14 BALL (SUTURE) ×4 IMPLANT
SUT VIC AB 2-0 CT1 36 (SUTURE) ×4 IMPLANT
SUT VIC AB 3-0 SH 8-18 (SUTURE) ×4 IMPLANT
SUT VIC AB 3-0 X1 27 (SUTURE) ×4 IMPLANT
SUTURE E-PAK OPEN HEART (SUTURE) ×4 IMPLANT
SWAB COLLECTION DEVICE MRSA (MISCELLANEOUS) ×4 IMPLANT
SYR 20ML ECCENTRIC (SYRINGE) ×4 IMPLANT
SYRINGE 10CC LL (SYRINGE) ×4 IMPLANT
SYSTEM SAHARA CHEST DRAIN RE-I (WOUND CARE) ×4 IMPLANT
TAPE CLOTH SURG 4X10 WHT LF (GAUZE/BANDAGES/DRESSINGS) ×4 IMPLANT
TOWEL NATURAL 4PK STERILE (DISPOSABLE) ×4 IMPLANT
TOWEL OR 17X24 6PK STRL BLUE (TOWEL DISPOSABLE) ×8 IMPLANT
TOWEL OR 17X26 10 PK STRL BLUE (TOWEL DISPOSABLE) ×8 IMPLANT
TRAP SPECIMEN MUCOUS 40CC (MISCELLANEOUS) IMPLANT
TRAY CATH LUMEN 1 20CM STRL (SET/KITS/TRAYS/PACK) ×4 IMPLANT
TRAY FOLEY IC TEMP SENS 14FR (CATHETERS) ×4 IMPLANT
TUBE ANAEROBIC SPECIMEN COL (MISCELLANEOUS) ×4 IMPLANT
TUBE CONNECTING 12X1/4 (SUCTIONS) IMPLANT
TUBING ART PRESS 48 MALE/FEM (TUBING) ×8 IMPLANT
TUBING PVC 1/4X1/16 WALL 8 (MISCELLANEOUS) ×8 IMPLANT
YANKAUER SUCT BULB TIP NO VENT (SUCTIONS) IMPLANT

## 2014-10-16 NOTE — OR Nursing (Signed)
SICU calls @ 1332 (1:30h call), 1353 (45 minutes call)

## 2014-10-16 NOTE — Anesthesia Postprocedure Evaluation (Signed)
  Anesthesia Post-op Note  Patient: Kenneth [name]  Procedure(s) Performed: Procedure(s) with comments: CANNULATION FOR CARDIOPULMONARY BYPASS (N/A) - Left femoral Line insertion with ultrasound CLOSURE OF PATENT FORAMEN OVALE (N/A) PACEMAKER LEAD EXTRACTION (Right) PACEMAKER LEAD REMOVAL (N/A) - Extraction defected paicing lead.  Removal endocardio  vegetation  Patient Location: SICU  Anesthesia Type:General  Level of Consciousness: sedated, unresponsive and Patient remains intubated per anesthesia plan  Airway and Oxygen Therapy: Patient remains intubated per anesthesia plan and Patient placed on Ventilator (see vital sign flow sheet for setting)  Post-op Pain: none  Post-op Assessment: Post-op Vital signs reviewed, Patient's Cardiovascular Status Stable and Respiratory Function Stable  Post-op Vital Signs: stable  Last Vitals:  Filed Vitals:   10/16/14 1500  BP: 136/74  Pulse: 69  Temp:   Resp: 12    Complications: No apparent anesthesia complications

## 2014-10-16 NOTE — Anesthesia Preprocedure Evaluation (Addendum)
Anesthesia Evaluation  Patient identified by MRN, date of birth, ID band Patient awake    Reviewed: Allergy & Precautions, NPO status , Patient's Chart, lab work & pertinent test results, reviewed documented beta blocker date and time   Airway Mallampati: III  TM Distance: >3 FB Neck ROM: Full    Dental  (+) Partial Upper, Teeth Intact, Dental Advisory Given   Pulmonary sleep apnea , COPDformer smoker,  breath sounds clear to auscultation        Cardiovascular hypertension, Pt. on medications and Pt. on home beta blockers + CAD + dysrhythmias Rhythm:Regular Rate:Normal     Neuro/Psych    GI/Hepatic GERD-  Medicated and Controlled,  Endo/Other  diabetes, Well Controlled  Renal/GU Renal InsufficiencyRenal disease     Musculoskeletal  (+) Arthritis -,   Abdominal   Peds  Hematology   Anesthesia Other Findings   Reproductive/Obstetrics                            Anesthesia Physical Anesthesia Plan  ASA: III  Anesthesia Plan: General   Post-op Pain Management:    Induction: Intravenous  Airway Management Planned: Oral ETT  Additional Equipment: Arterial line, CVP, PA Cath, TEE and Ultrasound Guidance Line Placement  Intra-op Plan:   Post-operative Plan: Post-operative intubation/ventilation  Informed Consent: I have reviewed the patients History and Physical, chart, labs and discussed the procedure including the risks, benefits and alternatives for the proposed anesthesia with the patient or authorized representative who has indicated his/her understanding and acceptance.   Dental advisory given  Plan Discussed with: CRNA, Anesthesiologist and Surgeon  Anesthesia Plan Comments:        Anesthesia Quick Evaluation

## 2014-10-16 NOTE — Anesthesia Procedure Notes (Signed)
Procedure Name: Intubation Date/Time: 10/16/2014 8:48 AM Performed by: Charm BargesBUTLER, Sorren Vallier R Pre-anesthesia Checklist: Patient identified, Emergency Drugs available, Suction available, Patient being monitored and Timeout performed Patient Re-evaluated:Patient Re-evaluated prior to inductionOxygen Delivery Method: Circle system utilized Preoxygenation: Pre-oxygenation with 100% oxygen Intubation Type: IV induction Ventilation: Mask ventilation without difficulty Laryngoscope Size: Glidescope Grade View: Grade II Tube type: Oral Tube size: 8.5 mm Number of attempts: 1 Airway Equipment and Method: Rigid stylet and Video-laryngoscopy Placement Confirmation: ETT inserted through vocal cords under direct vision and positive ETCO2 Secured at: 22 cm Tube secured with: Tape Dental Injury: Teeth and Oropharynx as per pre-operative assessment

## 2014-10-16 NOTE — Progress Notes (Signed)
  Echocardiogram Echocardiogram Transesophageal has been performed.  Griff Badley FRANCES 10/16/2014, 10:24 AM

## 2014-10-16 NOTE — Progress Notes (Signed)
ANTICOAGULATION CONSULT NOTE   Pharmacy Consult for heparin  Indication: recent PE  Allergies  Allergen Reactions  . Lovastatin Other (See Comments)    myalgias  . Lisinopril Itching    Patient Measurements: Height: 5' 3.5" (161.3 cm) Weight: 241 lb 13.5 oz (109.7 kg) IBW/kg (Calculated) : 58.05 Heparin Dosing Weight: 83kg  Vital Signs: Temp: 97.9 F (36.6 C) (05/25 0523) Temp Source: Oral (05/25 0523) BP: 125/54 mmHg (05/24 2044) Pulse Rate: 71 (05/25 0523)  Labs:  Recent Labs  10/14/14 0430 10/15/14 0020 10/15/14 0421 10/15/14 1055 10/15/14 2018 10/16/14 0529  HGB 15.9  --  16.1  --   --   --   HCT 46.4  --  48.5  --   --   --   PLT 145*  --  143*  --   --   --   APTT  --   --   --   --  106*  --   LABPROT 21.5*  --  17.5*  --   --  16.3*  INR 1.88*  --  1.42  --   --  1.30  HEPARINUNFRC  --  0.60 0.40  --   --  0.86*  CREATININE 1.83*  --   --  1.76*  --   --     Estimated Creatinine Clearance: 41.6 mL/min (by C-G formula based on Cr of 1.76).   Medical History: Past Medical History  Diagnosis Date  . 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 Overweight(278.02)   . Hyperlipemia   . CAD (coronary artery disease)     a. details unclear  . HTN (hypertension)   . Osteoarthritis   . GERD (gastroesophageal reflux disease)   . BPH (benign prostatic hyperplasia)   . OSA (obstructive sleep apnea)     a. on CPAP  . Type 2 diabetes mellitus   . COPD (chronic obstructive pulmonary disease)   . Renal impairment   . Chronic anxiety   . Obesity     Medications:  Scheduled:  . aminocaproic acid (AMICAR) for OHS   Intravenous To OR  . atenolol  100 mg Oral Daily  . cefUROXime (ZINACEF)  IV  1.5 g Intravenous To OR  . cefUROXime (ZINACEF)  IV  750 mg Intravenous To OR  . dexmedetomidine  0.1-0.7 mcg/kg/hr Intravenous To OR  . docusate sodium  100 mg Oral BID  . DOPamine  0-10 mcg/kg/min Intravenous To OR  . epinephrine  0-10 mcg/min Intravenous  To OR  . famotidine  20 mg Oral BID  . furosemide  40 mg Oral Daily  . heparin-papaverine-plasmalyte irrigation   Irrigation To OR  . heparin 30,000 units/NS 1000 mL solution for CELLSAVER   Other To OR  . insulin (NOVOLIN-R) infusion   Intravenous To OR  . losartan  50 mg Oral Daily  . magnesium sulfate  40 mEq Other To OR  . nitroGLYCERIN  2-200 mcg/min Intravenous To OR  . phenylephrine (NEO-SYNEPHRINE) Adult infusion  30-200 mcg/min Intravenous To OR  . potassium chloride  80 mEq Other To OR  . rifampin (RIFADIN) IVPB  300 mg Intravenous Q12H  . sodium chloride  3 mL Intravenous Q12H  . sodium chloride  3 mL Intravenous Q12H  . vancomycin  1,250 mg Intravenous Q24H    Assessment: 74 yo male with recent PE (~ 2 months ago) on coumadin PTA and now on hold for PPM extraction and possible PFO closure.  INR today is 1.42 and on heparin while coumadin is on hold. hg/hct= 16.1/48.5, plt= 143.  AM HL is SUPRAtherapeutic at 0.86 on heparin 1300 units/hr. No issues with infusion or bleeding noted. Pt is scheduled for this OR this morning. Will reduce rate until patient is taken to OR.  Goal of Therapy:  Heparin level 0.3-0.7 units/ml Monitor platelets by anticoagulation protocol: Yes   Plan:  -Decrease heparin to 1150 units/hr -Heparin level and CBC daily -Will follow anticoagulation plans post OR on 5/25  Arlean Hoppingorey M. Newman PiesBall, PharmD Clinical Pharmacist Pager [phone]  10/16/2014 6:57 AM

## 2014-10-16 NOTE — Progress Notes (Signed)
Report given to anesthesia in OR. Belongings taken to 2S.

## 2014-10-16 NOTE — Brief Op Note (Addendum)
      [address]       [name] [id number]             [phone]      10/16/2014  2:08 PM  PATIENT:  Kenneth FavorClyde Leaf  74 y.o. male  PRE-OPERATIVE DIAGNOSIS:  1. ENDOCARDITIS-PPM 2. PATENT FORAMEN OVALE  POST-OPERATIVE DIAGNOSIS:  1. ENDOCARDITIS-PPM 2. QUESTIONABLE PATENT FORAMEN OVALE  PROCEDURE:  MEDIAN STERNOTOMY for REMOVAL OF ENDOCARDIAL VEGETATION, EXTRACTION of INFECTED PACEMAKER LEADS on CARDIOPULMONARY BYPASS,and CLOSURE OF PATENT FORAMEN OVALE    SURGEON:  Surgeon(s) and Role: Panel 1:    * Delight OvensEdward B Beauden Tremont, MD - Primary     * Marinus MawGregg W Taylor, MD - Primary  PHYSICIAN ASSISTANT: Doree Fudgeonielle Zimmerman PA-C  ANESTHESIA:   general  EBL:  Total I/O In: 2750 [I.V.:2000; Blood:750] Out: 250 [Urine:250]  DRAINS: Two Blake drains placed in the mediastinal space   SPECIMEN:  Source of Specimen:  Endocardial vegetation  DISPOSITION OF SPECIMEN:  Patholgy, culture, gram stain  COUNTS CORRECT:  YES  DICTATION: .Dragon Dictation  PLAN OF CARE: Admit to inpatient   PATIENT DISPOSITION:  ICU - intubated and hemodynamically stable.   Delay start of Pharmacological VTE agent (>24hrs) due to surgical blood loss or risk of bleeding: yes  BASELINE WEIGHT: 110 kg Reatha ArmourDiana M Acuna  Signed  OR Nursing 10/16/2014 2:37 PM    Expand All Collapse All

## 2014-10-16 NOTE — Progress Notes (Signed)
Pt sent off unit to OR. Arabella MerlesP. Amo Dillan Lunden RN.

## 2014-10-16 NOTE — Transfer of Care (Signed)
Immediate Anesthesia Transfer of Care Note  Patient: Kenneth [name]  Procedure(s) Performed: Procedure(s) with comments: CANNULATION FOR CARDIOPULMONARY BYPASS (N/A) - Left femoral Line insertion with ultrasound CLOSURE OF PATENT FORAMEN OVALE (N/A) PACEMAKER LEAD EXTRACTION (Right) PACEMAKER LEAD REMOVAL (N/A) - Extraction defected paicing lead.  Removal endocardio  vegetation  Patient Location: ICU  Anesthesia Type:General  Level of Consciousness: sedated and Patient remains intubated per anesthesia plan  Airway & Oxygen Therapy: Patient remains intubated per anesthesia plan and Patient placed on Ventilator (see vital sign flow sheet for setting)  Post-op Assessment: Report given to RN and Post -op Vital signs reviewed and stable  Post vital signs: Reviewed and stable  Last Vitals:  Filed Vitals:   10/16/14 0523  BP:   Pulse: 71  Temp: 36.6 C  Resp: 18    Complications: No apparent anesthesia complications

## 2014-10-16 NOTE — Progress Notes (Signed)
Recruitment maneuver done x1 due to low pO2 on ABG, and low lung volumes on post op chest xray. Pt tolerated well. RT will continue to monitor.

## 2014-10-16 NOTE — Progress Notes (Signed)
[facility] BRIEF SICU PROGRESS NOTE  Day of Surgery  S/P Procedure(s) (LRB): CANNULATION FOR CARDIOPULMONARY BYPASS (N/A) CLOSURE OF PATENT FORAMEN OVALE (N/A) PACEMAKER LEAD EXTRACTION (Right) PACEMAKER LEAD REMOVAL (N/A)   Sedated on vent Paced rhythm w/ stable hemodynamics Chest tube output low UOP excellent  Plan: Continue routine early postop  Purcell NailsClarence H Owen 10/16/2014 8:29 PM

## 2014-10-17 ENCOUNTER — Inpatient Hospital Stay (HOSPITAL_COMMUNITY): Payer: Medicare Other

## 2014-10-17 ENCOUNTER — Encounter (HOSPITAL_COMMUNITY): Payer: Self-pay | Admitting: Cardiothoracic Surgery

## 2014-10-17 DIAGNOSIS — T827XXD Infection and inflammatory reaction due to other cardiac and vascular devices, implants and grafts, subsequent encounter: Secondary | ICD-10-CM

## 2014-10-17 LAB — CULTURE, BLOOD (ROUTINE X 2)
CULTURE: NO GROWTH
Culture: NO GROWTH

## 2014-10-17 LAB — CREATININE, SERUM
Creatinine, Ser: 2.09 mg/dL — ABNORMAL HIGH (ref 0.61–1.24)
GFR calc Af Amer: 34 mL/min — ABNORMAL LOW (ref >60)
GFR calc non Af Amer: 30 mL/min — ABNORMAL LOW (ref >60)

## 2014-10-17 LAB — CBC
HCT: 39.4 % (ref 39.0–52.0)
HEMATOCRIT: 40.5 % (ref 39.0–52.0)
Hemoglobin: 13.4 g/dL (ref 13.0–17.0)
Hemoglobin: 12.9 g/dL — ABNORMAL LOW (ref 13.0–17.0)
MCH: 28.2 pg (ref 26.0–34.0)
MCH: 28.2 pg (ref 26.0–34.0)
MCHC: 33.1 g/dL (ref 30.0–36.0)
MCHC: 32.7 g/dL (ref 30.0–36.0)
MCV: 85.3 fL (ref 78.0–100.0)
MCV: 86.2 fL (ref 78.0–100.0)
Platelets: 108 10*3/uL — ABNORMAL LOW (ref 150–400)
Platelets: 101 10*3/uL — ABNORMAL LOW (ref 150–400)
RBC: 4.75 MIL/uL (ref 4.22–5.81)
RBC: 4.57 MIL/uL (ref 4.22–5.81)
RDW: 14.5 % (ref 11.5–15.5)
RDW: 14.7 % (ref 11.5–15.5)
WBC: 13.9 10*3/uL — ABNORMAL HIGH (ref 4.0–10.5)
WBC: 14.6 10*3/uL — ABNORMAL HIGH (ref 4.0–10.5)

## 2014-10-17 LAB — GLUCOSE, CAPILLARY
Glucose-Capillary: 113 mg/dL — ABNORMAL HIGH (ref 65–99)
Glucose-Capillary: 122 mg/dL — ABNORMAL HIGH (ref 65–99)
Glucose-Capillary: 122 mg/dL — ABNORMAL HIGH (ref 65–99)
Glucose-Capillary: 123 mg/dL — ABNORMAL HIGH (ref 65–99)
Glucose-Capillary: 126 mg/dL — ABNORMAL HIGH (ref 65–99)
Glucose-Capillary: 127 mg/dL — ABNORMAL HIGH (ref 65–99)
Glucose-Capillary: 128 mg/dL — ABNORMAL HIGH (ref 65–99)
Glucose-Capillary: 128 mg/dL — ABNORMAL HIGH (ref 65–99)
Glucose-Capillary: 131 mg/dL — ABNORMAL HIGH (ref 65–99)
Glucose-Capillary: 133 mg/dL — ABNORMAL HIGH (ref 65–99)
Glucose-Capillary: 133 mg/dL — ABNORMAL HIGH (ref 65–99)
Glucose-Capillary: 134 mg/dL — ABNORMAL HIGH (ref 65–99)
Glucose-Capillary: 134 mg/dL — ABNORMAL HIGH (ref 65–99)
Glucose-Capillary: 135 mg/dL — ABNORMAL HIGH (ref 65–99)
Glucose-Capillary: 136 mg/dL — ABNORMAL HIGH (ref 65–99)
Glucose-Capillary: 136 mg/dL — ABNORMAL HIGH (ref 65–99)
Glucose-Capillary: 136 mg/dL — ABNORMAL HIGH (ref 65–99)
Glucose-Capillary: 137 mg/dL — ABNORMAL HIGH (ref 65–99)
Glucose-Capillary: 138 mg/dL — ABNORMAL HIGH (ref 65–99)
Glucose-Capillary: 139 mg/dL — ABNORMAL HIGH (ref 65–99)
Glucose-Capillary: 147 mg/dL — ABNORMAL HIGH (ref 65–99)
Glucose-Capillary: 148 mg/dL — ABNORMAL HIGH (ref 65–99)
Glucose-Capillary: 171 mg/dL — ABNORMAL HIGH (ref 65–99)
Glucose-Capillary: 176 mg/dL — ABNORMAL HIGH (ref 65–99)

## 2014-10-17 LAB — POCT I-STAT, CHEM 8
BUN: 28 mg/dL — ABNORMAL HIGH (ref 6–20)
Calcium, Ion: 1.11 mmol/L — ABNORMAL LOW (ref 1.13–1.30)
Chloride: 101 mmol/L (ref 101–111)
Creatinine, Ser: 1.9 mg/dL — ABNORMAL HIGH (ref 0.61–1.24)
Glucose, Bld: 177 mg/dL — ABNORMAL HIGH (ref 65–99)
HCT: 42 % (ref 39.0–52.0)
Hemoglobin: 14.3 g/dL (ref 13.0–17.0)
Potassium: 4.3 mmol/L (ref 3.5–5.1)
Sodium: 137 mmol/L (ref 135–145)
TCO2: 22 mmol/L (ref 0–100)

## 2014-10-17 LAB — BASIC METABOLIC PANEL
ANION GAP: 9 (ref 5–15)
BUN: 25 mg/dL — AB (ref 6–20)
CALCIUM: 7.9 mg/dL — AB (ref 8.9–10.3)
CO2: 25 mmol/L (ref 22–32)
Chloride: 106 mmol/L (ref 101–111)
Creatinine, Ser: 2.05 mg/dL — ABNORMAL HIGH (ref 0.61–1.24)
GFR calc Af Amer: 35 mL/min — ABNORMAL LOW (ref >60)
GFR, EST NON AFRICAN AMERICAN: 30 mL/min — AB (ref >60)
Glucose, Bld: 143 mg/dL — ABNORMAL HIGH (ref 65–99)
Potassium: 4.4 mmol/L (ref 3.5–5.1)
SODIUM: 140 mmol/L (ref 135–145)

## 2014-10-17 LAB — MAGNESIUM
Magnesium: 2.4 mg/dL (ref 1.7–2.4)
Magnesium: 2.2 mg/dL (ref 1.7–2.4)

## 2014-10-17 LAB — POCT I-STAT 3, ART BLOOD GAS (G3+)
Acid-base deficit: 4 mmol/L — ABNORMAL HIGH (ref 0.0–2.0)
Acid-base deficit: 4 mmol/L — ABNORMAL HIGH (ref 0.0–2.0)
Bicarbonate: 21.7 mEq/L (ref 20.0–24.0)
Bicarbonate: 22.8 mEq/L (ref 20.0–24.0)
O2 Saturation: 93 %
O2 Saturation: 95 %
Patient temperature: 37
Patient temperature: 37
TCO2: 23 mmol/L (ref 0–100)
TCO2: 24 mmol/L (ref 0–100)
pCO2 arterial: 42 mmHg (ref 35.0–45.0)
pCO2 arterial: 46.3 mmHg — ABNORMAL HIGH (ref 35.0–45.0)
pH, Arterial: 7.299 — ABNORMAL LOW (ref 7.350–7.450)
pH, Arterial: 7.322 — ABNORMAL LOW (ref 7.350–7.450)
pO2, Arterial: 75 mmHg — ABNORMAL LOW (ref 80.0–100.0)
pO2, Arterial: 80 mmHg (ref 80.0–100.0)

## 2014-10-17 LAB — HEMOGLOBIN A1C
Hgb A1c MFr Bld: 7 % — ABNORMAL HIGH (ref 4.8–5.6)
Mean Plasma Glucose: 154 mg/dL

## 2014-10-17 LAB — PROTIME-INR
INR: 1.23 (ref 0.00–1.49)
PROTHROMBIN TIME: 15.7 s — AB (ref 11.6–15.2)

## 2014-10-17 MED ORDER — FUROSEMIDE 10 MG/ML IJ SOLN
40.0000 mg | Freq: Once | INTRAMUSCULAR | Status: AC
  Administered 2014-10-17: 40 mg via INTRAVENOUS
  Filled 2014-10-17: qty 4

## 2014-10-17 MED ORDER — ENOXAPARIN SODIUM 30 MG/0.3ML ~~LOC~~ SOLN
30.0000 mg | SUBCUTANEOUS | Status: DC
  Administered 2014-10-17 – 2014-10-21 (×5): 30 mg via SUBCUTANEOUS
  Filled 2014-10-17 (×6): qty 0.3

## 2014-10-17 MED ORDER — CEFAZOLIN SODIUM-DEXTROSE 2-3 GM-% IV SOLR
2.0000 g | Freq: Three times a day (TID) | INTRAVENOUS | Status: DC
  Administered 2014-10-17 – 2014-10-22 (×15): 2 g via INTRAVENOUS
  Filled 2014-10-17 (×19): qty 50

## 2014-10-17 MED ORDER — INSULIN DETEMIR 100 UNIT/ML ~~LOC~~ SOLN: 10.0000 [IU] | Freq: Every day | SUBCUTANEOUS | Status: DC

## 2014-10-17 MED ORDER — WARFARIN SODIUM 2.5 MG PO TABS
2.5000 mg | ORAL_TABLET | Freq: Once | ORAL | Status: AC
  Administered 2014-10-17: 2.5 mg via ORAL
  Filled 2014-10-17: qty 1

## 2014-10-17 MED ORDER — WARFARIN - PHYSICIAN DOSING INPATIENT
Freq: Every day | Status: DC
  Administered 2014-10-19: 18:00:00
  Administered 2014-10-20: 1
  Administered 2014-10-21: 18:00:00

## 2014-10-17 MED ORDER — INSULIN DETEMIR 100 UNIT/ML ~~LOC~~ SOLN
10.0000 [IU] | Freq: Every day | SUBCUTANEOUS | Status: DC
  Administered 2014-10-17 – 2014-10-22 (×6): 10 [IU] via SUBCUTANEOUS
  Filled 2014-10-17 (×6): qty 0.1

## 2014-10-17 MED ORDER — INSULIN ASPART 100 UNIT/ML ~~LOC~~ SOLN
0.0000 [IU] | Freq: Three times a day (TID) | SUBCUTANEOUS | Status: DC
  Administered 2014-10-17 – 2014-10-18 (×2): 2 [IU] via SUBCUTANEOUS

## 2014-10-17 MED ORDER — INSULIN ASPART 100 UNIT/ML ~~LOC~~ SOLN
0.0000 [IU] | SUBCUTANEOUS | Status: DC
  Administered 2014-10-17: 4 [IU] via SUBCUTANEOUS
  Administered 2014-10-17: 2 [IU] via SUBCUTANEOUS

## 2014-10-17 MED FILL — Heparin Sodium (Porcine) Inj 1000 Unit/ML: INTRAMUSCULAR | Qty: 30 | Status: AC

## 2014-10-17 MED FILL — Magnesium Sulfate Inj 50%: INTRAMUSCULAR | Qty: 10 | Status: AC

## 2014-10-17 MED FILL — Potassium Chloride Inj 2 mEq/ML: INTRAVENOUS | Qty: 40 | Status: AC
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
## 2014-10-17 NOTE — Progress Notes (Addendum)
Patient ID: Glenford Garis, male   DOB: 06-Nov-1940, 74 y.o.   MRN: [id number] TCTS DAILY ICU PROGRESS NOTE                   [address]            [place] [address]          [phone]   1 Day Post-Op Procedure(s) (LRB): CANNULATION FOR CARDIOPULMONARY BYPASS (N/A) CLOSURE OF PATENT FORAMEN OVALE (N/A) PACEMAKER LEAD EXTRACTION (Right) PACEMAKER LEAD REMOVAL (N/A)  Total Length of Stay:  LOS: 7 days   Subjective: Extubated , awake and alert neuro intact  Objective: Vital signs in last 24 hours: Temp:  [97.3 F (36.3 C)-98.8 F (37.1 C)] 97.9 F (36.6 C) (05/26 0700) Pulse Rate:  [63-91] 90 (05/26 0700) Cardiac Rhythm:  [-] Atrial paced (05/26 0400) Resp:  [0-25] 23 (05/26 0700) BP: (76-136)/(49-74) 112/60 mmHg (05/25 2353) SpO2:  [89 %-100 %] 95 % (05/26 0700) Arterial Line BP: (108-116)/(65-73) 116/73 mmHg (05/25 1530) FiO2 (%):  [40 %-50 %] 40 % (05/26 0000) Weight:  [259 lb 4.2 oz (117.6 kg)] 259 lb 4.2 oz (117.6 kg) (05/26 0500)  Filed Weights   10/15/14 0332 10/16/14 0523 10/17/14 0500  Weight: 242 lb 8.1 oz (110 kg) 241 lb 13.5 oz (109.7 kg) 259 lb 4.2 oz (117.6 kg)    Weight change: 17 lb 6.7 oz (7.9 kg)   Hemodynamic parameters for last 24 hours: PAP: (22-39)/(8-26) 30/13 mmHg CO:  [2.3 L/min-5.6 L/min] 5.6 L/min CI:  [2.1 L/min/m2-5.2 L/min/m2] 5.2 L/min/m2  Intake/Output from previous day: 05/25 0701 - 05/26 0700 In: 7280.1 [I.V.:5250.1; Blood:750; NG/GT:30; IV Piggyback:1250] Out: 2365 [Urine:2195; Chest Tube:170]  Intake/Output this shift: Total I/O In: -  Out: 100 [Urine:100]  Current Meds: Scheduled Meds: . acetaminophen  1,000 mg Oral 4 times per day   Or  . acetaminophen (TYLENOL) oral liquid 160 mg/5 mL  1,000 mg Per Tube 4 times per day  . antiseptic oral rinse  7 mL Mouth Rinse QID  . aspirin EC  325 mg Oral Daily   Or  . aspirin  324 mg Per Tube Daily  . bisacodyl  10 mg Oral Daily   Or  . bisacodyl  10 mg  Rectal Daily  . chlorhexidine  15 mL Mouth Rinse BID  . docusate sodium  200 mg Oral Daily  . famotidine  20 mg Oral BID  . furosemide  40 mg Intravenous Once  . insulin aspart  0-24 Units Subcutaneous 6 times per day  . insulin detemir  10 Units Subcutaneous Daily  . [START ON 10/18/2014] insulin detemir  10 Units Subcutaneous Daily  . insulin regular  0-10 Units Intravenous TID WC  . metoprolol tartrate  12.5 mg Oral BID   Or  . metoprolol tartrate  12.5 mg Per Tube BID  . rifampin (RIFADIN) IVPB  300 mg Intravenous Q12H  . sodium chloride  3 mL Intravenous Q12H  . vancomycin  1,250 mg Intravenous Q24H  . warfarin  2.5 mg Oral ONCE-1800   Continuous Infusions: . sodium chloride 20 mL/hr at 10/17/14 0600  . sodium chloride    . sodium chloride 20 mL/hr at 10/17/14 0400  . dexmedetomidine Stopped (10/16/14 1815)  . DOPamine 2.5 mcg/kg/min (10/17/14 0600)  . insulin (NOVOLIN-R) infusion 3.2 Units/hr (10/17/14 0700)  . lactated ringers 20 mL/hr at 10/17/14 0600  . lactated ringers Stopped (10/16/14 1530)  . nitroGLYCERIN Stopped (10/16/14 1515)  .  phenylephrine (NEO-SYNEPHRINE) Adult infusion 5 mcg/min (10/17/14 0645)   PRN Meds:.sodium chloride, albumin human, metoprolol, midazolam, morphine injection, ondansetron (ZOFRAN) IV, oxyCODONE, sodium chloride, traMADol  General appearance: alert, cooperative, appears stated age and no distress Neurologic: intact Heart: regular rate and rhythm, S1, S2 normal, no murmur, click, rub or gallop Lungs: diminished breath sounds bibasilar Abdomen: soft, non-tender; bowel sounds normal; no masses,  no organomegaly Extremities: extremities normal, atraumatic, no cyanosis or edema and Homans sign is negative, no sign of DVT Wound: sternum stable  Lab Results: CBC:  Recent Labs  10/16/14 2100 10/16/14 2116 10/17/14 0400  WBC 14.4*  --  13.9*  HGB 13.8 14.6 13.4  HCT 41.3 43.0 40.5  PLT 109*  --  108*   BMET:  Recent Labs   10/16/14 0519  10/16/14 2116 10/17/14 0400  NA 137  < > 137 140  K 4.5  < > 4.5 4.4  CL 102  < > 104 106  CO2 23  --   --  25  GLUCOSE 113*  < > 138* 143*  BUN 27*  < > 25* 25*  CREATININE 2.07*  < > 1.90* 2.05*  CALCIUM 9.8  --   --  7.9*  < > = values in this interval not displayed.  PT/INR:   Recent Labs  10/17/14 0400  LABPROT 15.7*  INR 1.23   Radiology: Dg Chest Port 1 View  10/17/2014   CLINICAL DATA:  Follow-up of pneumothorax, closure patent foramen ovale, pacemaker removal.  EXAM: PORTABLE CHEST - 1 VIEW  COMPARISON:  Portable chest x-ray of Oct 16, 2014  FINDINGS: There has been interval extubation of the trachea and of the esophagus. A mediastinal drain and left lower chest tube can be faintly discerned. The Swan-Ganz catheter tip projects in the proximal right main pulmonary artery. The cardiac silhouette is mildly enlarged. The pulmonary vascularity is not engorged. There is subsegmental atelectasis at the right lung base. There is no pleural effusion or pneumothorax  IMPRESSION: There is no pneumothorax. There is persistent subsegmental atelectasis at the right lung base. There is stable mild enlargement of the cardiac silhouette without pulmonary edema.   Electronically Signed   By: David  SwazilandJordan M.D.   On: 10/17/2014 08:00   Dg Chest Port 1 View  10/16/2014   CLINICAL DATA:  Status post closure of patent foramen ovale. Pacemaker removal. Hypoxia.  EXAM: PORTABLE CHEST - 1 VIEW  COMPARISON:  Oct 15, 2014  FINDINGS: Endotracheal tube tip is 2.9 cm above the carina. Pacemaker has been removed. Central catheter tip is at the origin of the right main pulmonary outflow tract. There is a chest tube with tip and side port in stomach. There is a left chest tube and a mediastinal drain. Temporary pacemaker wires are attached to the right. No pneumothorax. Lungs are clear. Heart is upper normal in size with pulmonary vascularity within normal limits. No adenopathy. Multiple metallic  fragments are noted over the right hemithorax.  IMPRESSION: Tube and catheter positions as described without pneumothorax. No edema or consolidation.   Electronically Signed   By: Bretta BangWilliam  Woodruff III M.D.   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

## 2014-10-17 NOTE — Procedures (Signed)
Extubation Procedure Note  Patient Details:   Name: Kenneth [name] DOB: [birth year] MRN: [id number]   Airway Documentation:  Airway 8.5 mm (Active)  Secured at (cm) 21 cm 10/16/2014  9:05 PM  Measured From Lips 10/16/2014  9:05 PM  Secured Location Right 10/16/2014  9:05 PM  Secured By Pink Tape 10/16/2014  9:05 PM  Cuff Pressure (cm H2O) 22 cm H2O 10/16/2014  9:05 PM  Site Condition Dry 10/16/2014  4:00 PM    Evaluation  O2 sats: stable throughout Complications: No apparent complications Patient did tolerate procedure well. Bilateral Breath Sounds: Clear Patient extubated to 5 lpm nasal cannula.  NIF -38, VC 1.1L  Cuff leak noted. Patient able to vocalize post extubation.  Patient stable.   Clent Ridgesllis, Elzia Hott Michelle 10/17/2014, 1:10 AM

## 2014-10-17 NOTE — Progress Notes (Signed)
Patient ID: Kenneth [name], male   DOB: 12/24/1940, 74 y.o.   MRN: [id number] EVENING ROUNDS NOTE :     [address]       [place] [id number]             [phone]                 1 Day Post-Op Procedure(s) (LRB): CANNULATION FOR CARDIOPULMONARY BYPASS (N/A) CLOSURE OF PATENT FORAMEN OVALE (N/A) PACEMAKER LEAD EXTRACTION (Right) PACEMAKER LEAD REMOVAL (N/A)  Total Length of Stay:  LOS: 7 days  BP 117/95 mmHg  Pulse 90  Temp(Src) 97.6 F (36.4 C) (Oral)  Resp 33  Ht 5' 3.5" (1.613 m)  Wt 259 lb 4.2 oz (117.6 kg)  BMI 45.20 kg/m2  SpO2 91%  .Intake/Output      05/26 0701 - 05/27 0700   I.V. (mL/kg)    Blood    NG/GT    IV Piggyback 50   Total Intake(mL/kg) 50 (0.4)   Urine (mL/kg/hr) 800 (0.5)   Chest Tube 90 (0.1)   Total Output 890   Net -840         . sodium chloride 20 mL/hr at 10/17/14 1845  . sodium chloride    . sodium chloride 20 mL/hr at 10/17/14 0400  . dexmedetomidine Stopped (10/16/14 1815)  . DOPamine 2.5 mcg/kg/min (10/17/14 0600)  . lactated ringers 20 mL/hr at 10/17/14 0600  . lactated ringers Stopped (10/16/14 1530)  . nitroGLYCERIN Stopped (10/16/14 1515)  . phenylephrine (NEO-SYNEPHRINE) Adult infusion 5 mcg/min (10/17/14 0645)     Lab Results  Component Value Date   WBC 14.6* 10/17/2014   HGB 14.3 10/17/2014   HCT 42.0 10/17/2014   PLT 101* 10/17/2014   GLUCOSE 177* 10/17/2014   ALT 23 10/15/2014   AST 29 10/15/2014   NA 137 10/17/2014   K 4.3 10/17/2014   CL 101 10/17/2014   CREATININE 1.90* 10/17/2014   BUN 28* 10/17/2014   CO2 25 10/17/2014   TSH 1.831 09/23/2014   INR 1.23 10/17/2014   HGBA1C 7.0* 10/16/2014   Stable day Will need pic for long term antidotic therapy Watch cr closely Started back on coumadin,   Delight OvensEdward B Phoenicia Pirie MD  Beeper [phone] Office [phone] 10/17/2014 7:26 PM

## 2014-10-18 ENCOUNTER — Inpatient Hospital Stay (HOSPITAL_COMMUNITY): Payer: Medicare Other

## 2014-10-18 LAB — BASIC METABOLIC PANEL
Anion gap: 7 (ref 5–15)
BUN: 28 mg/dL — ABNORMAL HIGH (ref 6–20)
CO2: 27 mmol/L (ref 22–32)
Calcium: 7.6 mg/dL — ABNORMAL LOW (ref 8.9–10.3)
Chloride: 102 mmol/L (ref 101–111)
Creatinine, Ser: 1.76 mg/dL — ABNORMAL HIGH (ref 0.61–1.24)
GFR calc Af Amer: 42 mL/min — ABNORMAL LOW (ref >60)
GFR calc non Af Amer: 37 mL/min — ABNORMAL LOW (ref >60)
Glucose, Bld: 118 mg/dL — ABNORMAL HIGH (ref 65–99)
Potassium: 4.2 mmol/L (ref 3.5–5.1)
Sodium: 136 mmol/L (ref 135–145)

## 2014-10-18 LAB — CULTURE, BLOOD (ROUTINE X 2): Culture: NO GROWTH

## 2014-10-18 LAB — CBC
HCT: 37.1 % — ABNORMAL LOW (ref 39.0–52.0)
Hemoglobin: 12 g/dL — ABNORMAL LOW (ref 13.0–17.0)
MCH: 27.8 pg (ref 26.0–34.0)
MCHC: 32.3 g/dL (ref 30.0–36.0)
MCV: 85.9 fL (ref 78.0–100.0)
Platelets: 93 10*3/uL — ABNORMAL LOW (ref 150–400)
RBC: 4.32 MIL/uL (ref 4.22–5.81)
RDW: 14.5 % (ref 11.5–15.5)
WBC: 13.7 10*3/uL — ABNORMAL HIGH (ref 4.0–10.5)

## 2014-10-18 LAB — GLUCOSE, CAPILLARY
Glucose-Capillary: 127 mg/dL — ABNORMAL HIGH (ref 65–99)
Glucose-Capillary: 119 mg/dL — ABNORMAL HIGH (ref 65–99)
Glucose-Capillary: 118 mg/dL — ABNORMAL HIGH (ref 65–99)
Glucose-Capillary: 115 mg/dL — ABNORMAL HIGH (ref 65–99)

## 2014-10-18 LAB — PROTIME-INR
INR: 1.29 (ref 0.00–1.49)
Prothrombin Time: 16.3 seconds — ABNORMAL HIGH (ref 11.6–15.2)

## 2014-10-18 MED ORDER — WARFARIN SODIUM 2.5 MG PO TABS
2.5000 mg | ORAL_TABLET | Freq: Once | ORAL | Status: AC
  Administered 2014-10-18: 2.5 mg via ORAL
  Filled 2014-10-18: qty 1

## 2014-10-18 MED ORDER — FUROSEMIDE 40 MG PO TABS
40.0000 mg | ORAL_TABLET | Freq: Every day | ORAL | Status: DC
  Administered 2014-10-18 – 2014-10-19 (×2): 40 mg via ORAL
  Filled 2014-10-18 (×3): qty 1

## 2014-10-18 MED ORDER — SODIUM CHLORIDE 0.9 % IV SOLN: 250.0000 mL | INTRAVENOUS | Status: DC | PRN

## 2014-10-18 MED ORDER — SODIUM CHLORIDE 0.9 % IJ SOLN
3.0000 mL | Freq: Two times a day (BID) | INTRAMUSCULAR | Status: DC
  Administered 2014-10-21: 3 mL via INTRAVENOUS

## 2014-10-18 MED ORDER — ONDANSETRON HCL 4 MG PO TABS: 4.0000 mg | ORAL_TABLET | Freq: Four times a day (QID) | ORAL | Status: DC | PRN

## 2014-10-18 MED ORDER — MOVING RIGHT ALONG BOOK
Freq: Once | Status: AC
  Administered 2014-10-18: 18:00:00
  Filled 2014-10-18: qty 1

## 2014-10-18 MED ORDER — INSULIN ASPART 100 UNIT/ML ~~LOC~~ SOLN
0.0000 [IU] | Freq: Three times a day (TID) | SUBCUTANEOUS | Status: DC
  Administered 2014-10-20: 2 [IU] via SUBCUTANEOUS

## 2014-10-18 MED ORDER — SODIUM CHLORIDE 0.9 % IJ SOLN
10.0000 mL | INTRAMUSCULAR | Status: DC | PRN
  Administered 2014-10-20 – 2014-10-22 (×3): 10 mL
  Filled 2014-10-18 (×3): qty 40

## 2014-10-18 MED ORDER — SODIUM CHLORIDE 0.9 % IJ SOLN: 3.0000 mL | INTRAMUSCULAR | Status: DC | PRN

## 2014-10-18 MED ORDER — ONDANSETRON HCL 4 MG/2ML IJ SOLN: 4.0000 mg | Freq: Four times a day (QID) | INTRAMUSCULAR | Status: DC | PRN

## 2014-10-18 MED ORDER — ALUM & MAG HYDROXIDE-SIMETH 200-200-20 MG/5ML PO SUSP: 15.0000 mL | ORAL | Status: DC | PRN

## 2014-10-18 MED ORDER — POTASSIUM CHLORIDE CRYS ER 20 MEQ PO TBCR
20.0000 meq | EXTENDED_RELEASE_TABLET | Freq: Every day | ORAL | Status: AC
  Administered 2014-10-18 – 2014-10-20 (×3): 20 meq via ORAL
  Filled 2014-10-18 (×3): qty 1

## 2014-10-18 NOTE — Op Note (Signed)
NAMEunice Blase:  Lipsky, Deo               ACCOUNT NO.:  [id number]  MEDICAL RECORD NO.:  [id number]  LOCATION:  2S07C                        FACILITY:  [facility]  PHYSICIAN:  Burna FortsJames T. Shoichi Mielke, M.D.DATE OF BIRTH:  [birth year]  DATE OF PROCEDURE:  10/16/2014 DATE OF DISCHARGE:                              OPERATIVE REPORT   INTRAOPERATIVE TRANSESOPHAGEAL ECHOCARDIOGRAPHIC REPORT  INDICATION FOR PROCEDURE:  The patient is a 74 year old gentleman of Dr. Salina AprilEdward Gerhardt's, who presents today with apparent thrombotic formation and vegetation on a pacemaker lead within the chambers of the heart.  He was brought to the OR today for induction of general anesthesia, after which, the trachea was intubated.  Then, the TEE probe was prepared and passed oropharyngeally into the stomach for imaging of the cardiac structures.  PRE-CARDIOPULMONARY BYPASS EXAMINATION:  Left Ventricle:  The left ventricular chamber is seen initially in the short-axis view. There is  mild left ventricular hypertrophy, that is appreciated.  There is mild diastolic dysfunction noted on pulse-wave Doppler.  Normal contractile patterns are appreciated.  Mitral Valve:  The mitral valve is seen initially in the four-chamber view.  These are mildly thickened leaflets noted.  There is normal excursion and normal-appearing coaptation.  On color Doppler, there is only mild trivial regurgitant flow appreciated across the mitral valve.  Left Atrium:  Normal left atrial chamber function is seen.  The area of the interatrial septum is interrogated and is seen to have a large, dilated, wavy, mobile, interatrial septum.  This moves both left and right with a wave formation.  There does appear to be two actual septal membranes just posterior to the aortic valve takeoff area appreciated. These do appear to be somewhat separated by few millimeters; however, we could not demonstrate any atrial defect or any patent foramen ovale in issue.  Color  Doppler shows no significant jet flow across this.  We then did a bubble study, which again demonstrated no transfer of bubbles from the right atrium into the left atrium.  We could not confirm the absolute presence of a patent foramen ovale.  Aortic Valve:  The aortic valve is seen in the short-axis view, there is mild aortic sclerosis.  It is trileaflet.  There is no aortic insufficiency appreciated.  Leaflets opened appropriately during systolic contraction and ejection.  Right Ventricle:  This is a normal right ventricular chamber seen in the four-chamber view.  There is normal contractility appreciated.  There is mildly increased chamber size noted.  Tricuspid Valve:  Attention was taken to the tricuspid valve itself.  In the area of the tricuspid valve just above the level, we can see attached to the pacing lead is a large 2-2.5 cm mobile mass attached to the lead itself.  It is highly mobile, moves down through the leaflets of the tricuspid valve, and then, during closure of the leaflets, this is moved upward into the atrium towards the medial aspect of the interatrial septum.  It appears confluent in its opacity.  It is a large mobile mass off the lead itself.  This is associated with a moderate tricuspid regurgitant jet noted in the right atrial chamber.  POST-CARDIOPULMONARY BYPASS TEE EXAMINATION (LIMITED EXAM):  In the early bypass period, there is good-to-excellent left ventricular function appreciated.  There is also good right ventricular function. Attention in the four-chamber view now shows that the pacemaker lead itself and the large thrombotic vegetative mass is now absent.  Close examination of the interatrial septum again does not demonstrate any shunt or PFO at this time.  Tricuspid Valve:  In the area of the tricuspid valve, we looked closely at the leaflets themselves.  They are mobile, compliant and moving appropriately.  Color Doppler across the valve  reveals a slightly eccentric, medially directed jet that is mild-to-moderate in size.  Rest of the cardiac examination was as previously described and the patient was returned to the cardiac intensive care unit in stable condition.          ______________________________ Burna Forts, M.D.     JTM/MEDQ  D:  10/17/2014  T:  10/18/2014  Job:  [id number]

## 2014-10-18 NOTE — Progress Notes (Addendum)
TCTS DAILY ICU PROGRESS NOTE                   [address]            [place] [id number]          [phone]   2 Days Post-Op Procedure(s) (LRB): CANNULATION FOR CARDIOPULMONARY BYPASS (N/A) CLOSURE OF PATENT FORAMEN OVALE (N/A) PACEMAKER LEAD EXTRACTION (Right) PACEMAKER LEAD REMOVAL (N/A)  Total Length of Stay:  LOS: 8 days   Subjective: OOB in chair.  Feels well, no complaints.   Objective: Vital signs in last 24 hours: Temp:  [97.6 F (36.4 C)-98.6 F (37 C)] 98.1 F (36.7 C) (05/27 0827) Pulse Rate:  [88-139] 90 (05/27 0827) Cardiac Rhythm:  [-] Atrial paced (05/27 0600) Resp:  [11-33] 18 (05/27 0827) BP: (82-127)/(49-95) 95/55 mmHg (05/27 0827) SpO2:  [88 %-99 %] 96 % (05/27 0827) Arterial Line BP: (90-149)/(61-142) 149/142 mmHg (05/26 1400) Weight:  [256 lb 6.4 oz (116.302 kg)] 256 lb 6.4 oz (116.302 kg) (05/27 0550)  Filed Weights   10/16/14 0523 10/17/14 0500 10/18/14 0550  Weight: 241 lb 13.5 oz (109.7 kg) 259 lb 4.2 oz (117.6 kg) 256 lb 6.4 oz (116.302 kg)  BASELINE WEIGHT: 110 kg  Weight change: -2 lb 13.8 oz (-1.298 kg)   Hemodynamic parameters for last 24 hours: PAP: (27-35)/(11-16) 29/14 mmHg  Intake/Output from previous day: 05/26 0701 - 05/27 0700 In: 2637.8 [P.O.:1380; I.V.:1107.8; IV Piggyback:150] Out: 1485 [Urine:1395; Chest Tube:90]  CBGs 177-171-136-118-127     Current Meds: Scheduled Meds: . acetaminophen  1,000 mg Oral 4 times per day   Or  . acetaminophen (TYLENOL) oral liquid 160 mg/5 mL  1,000 mg Per Tube 4 times per day  . antiseptic oral rinse  7 mL Mouth Rinse QID  . aspirin EC  325 mg Oral Daily   Or  . aspirin  324 mg Per Tube Daily  . bisacodyl  10 mg Oral Daily   Or  . bisacodyl  10 mg Rectal Daily  .  ceFAZolin (ANCEF) IV  2 g Intravenous 3 times per day  . chlorhexidine  15 mL Mouth Rinse BID  . docusate sodium  200 mg Oral Daily  . enoxaparin (LOVENOX) injection  30 mg Subcutaneous Q24H  .  famotidine  20 mg Oral BID  . insulin aspart  0-24 Units Subcutaneous TID AC & HS  . insulin detemir  10 Units Subcutaneous Daily  . metoprolol tartrate  12.5 mg Oral BID   Or  . metoprolol tartrate  12.5 mg Per Tube BID  . sodium chloride  3 mL Intravenous Q12H  . Warfarin - Physician Dosing Inpatient   Does not apply q1800   Continuous Infusions: . sodium chloride 20 mL/hr at 10/17/14 1845  . sodium chloride    . sodium chloride 20 mL/hr at 10/17/14 0400  . dexmedetomidine Stopped (10/16/14 1815)  . DOPamine 2.5 mcg/kg/min (10/17/14 0800)  . lactated ringers 20 mL/hr at 10/17/14 0800  . lactated ringers Stopped (10/16/14 1530)  . nitroGLYCERIN Stopped (10/16/14 1515)  . phenylephrine (NEO-SYNEPHRINE) Adult infusion Stopped (10/17/14 0800)   PRN Meds:.sodium chloride, metoprolol, midazolam, morphine injection, ondansetron (ZOFRAN) IV, oxyCODONE, sodium chloride, traMADol  Physical Exam: General appearance: alert, cooperative and no distress Heart: regular rate and rhythm Lungs: clear to auscultation bilaterally Extremities: Mild LE edema Wound: Dressed and dry  Lab Results: CBC: Recent Labs  10/17/14 1700 10/17/14 1713 10/18/14 0358  WBC 14.6*  --  13.7*  HGB 12.9* 14.3 12.0*  HCT 39.4 42.0 37.1*  PLT 101*  --  93*   BMET:  Recent Labs  10/17/14 0400  10/17/14 1713 10/18/14 0358  NA 140  --  137 136  K 4.4  --  4.3 4.2  CL 106  --  101 102  CO2 25  --   --  27  GLUCOSE 143*  --  177* 118*  BUN 25*  --  28* 28*  CREATININE 2.05*  < > 1.90* 1.76*  CALCIUM 7.9*  --   --  7.6*  < > = values in this interval not displayed.  PT/INR:  Recent Labs  10/18/14 0358  LABPROT 16.3*  INR 1.29   Radiology: Dg Chest Port 1 View  10/18/2014   CLINICAL DATA:  Endocarditis, pacemaker lead removal, closure patent foramen ovale  EXAM: PORTABLE CHEST - 1 VIEW  COMPARISON:  Portable chest x-ray of Oct 17, 2014  FINDINGS: There has been interval removal of the Swan-Ganz  catheter. The right internal jugular Cordis sheath remains. The lungs are adequately inflated. Subsegmental atelectasis at the right lung base has improved somewhat. The cardiac silhouette remains enlarged. The pulmonary vascularity is normal. The mediastinum is normal in width. Metallic shot over the right neck and right chest are unchanged. The patient has undergone previous median sternotomy.  IMPRESSION: Slight interval improvement in right basilar atelectasis. The Swan-Ganz catheter has been removed.   Electronically Signed   By: David  [place]aziland M.D.   On: 10/18/2014 07:47     Assessment/Plan: S/P Procedure(s) (LRB): CANNULATION FOR CARDIOPULMONARY BYPASS (N/A) CLOSURE OF PATENT FORAMEN OVALE (N/A) PACEMAKER LEAD EXTRACTION (Right) PACEMAKER LEAD REMOVAL (N/A)  CV- SR, HR 80s under pacer.  Hopefully can place pacer to backup.  BPs low normal.  Coumadin restarted for h/o PE. On Dopamine, wean and d/c as tolerated.  ID- MSSA, D#6 abx. Switched to Ancef yesterday. Will continue x 6 weeks. Will need PICC line.  WBC stable, trending down.  Expected postop blood loss anemia- H/H stable.  Chronic/Postop thrombocytopenia- plts down slightly, continue to watch, has had bone marrow bx previously  CKD, stage III- Cr trending down.  Endocrine- A1C=7.0.  CBGs stable, not on meds at home.  Continue low dose Levemir.  Continue current care. Possible tx stepdown soon if off gtts and remains stable.  COLLINS,GINA H 10/18/2014 8:30 AM   Cr at baseline Plan transfer to stepdown Back on coumadin, d/c lovenox when inr 2.0 History of recent PE on outside ct, but this may be vegetation embolization  pic for 6 weeks of ANcef I have seen and examined Kenneth [name] and agree with the above assessment  and plan.  Delight Ovens MD Beeper [phone] Office [phone] 10/18/2014 9:48 AM

## 2014-10-18 NOTE — Progress Notes (Signed)
Peripherally Inserted Central Catheter/Midline Placement  The IV Nurse has discussed with the patient and/or persons authorized to consent for the patient, the purpose of this procedure and the potential benefits and risks involved with this procedure.  The benefits include less needle sticks, lab draws from the catheter and patient may be discharged home with the catheter.  Risks include, but not limited to, infection, bleeding, blood clot (thrombus formation), and puncture of an artery; nerve damage and irregular heat beat.  Alternatives to this procedure were also discussed.  PICC/Midline Placement Documentation        Timmothy Soursewman, Shekera Beavers Renee 10/18/2014, 11:24 AM

## 2014-10-18 NOTE — Progress Notes (Signed)
Pt transferred to unit from 2S. Report received. VSS. Pt denies c/o of pain. Wife at bedside. Pt oriented to unit. Will continue to monitor.  Sandrea HammondJunris Ersilia Brawley RN

## 2014-10-18 NOTE — Evaluation (Signed)
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 5/25 underwent removal of lead wires to PPM and PFO closure. PMHx- Lt eye removal, MI, OA, DM  Clinical Impression  Patient is s/p above surgery resulting in functional limitations due to the deficits listed below (see PT Problem List). Pt required constant cues to adhere to sternal precautions. Able to verbalize rationale for precautions.  Patient will benefit from skilled PT to increase their independence and safety with mobility to allow discharge to the venue listed below.       Follow Up Recommendations No PT follow up;Supervision for mobility/OOB    Equipment Recommendations  Rolling walker with 5" wheels (TBA)    Recommendations for Other Services       Precautions / Restrictions Precautions Precautions: Sternal      Mobility  Bed Mobility Overal bed mobility: Needs Assistance Bed Mobility: Rolling;Sidelying to Sit Rolling: Mod assist Sidelying to sit: Min assist       General bed mobility comments: vc to maintain sternal precautions; assist to raise torso  Transfers Overall transfer level: Needs assistance Equipment used: Rolling walker (2 wheeled) Transfers: Sit to/from Stand Sit to Stand: Min assist         General transfer comment: vc with stand and sit to not use UEs; assist for balance  Ambulation/Gait Ambulation/Gait assistance: Min guard Ambulation Distance (Feet): 150 Feet Assistive device: Rolling walker (2 wheeled) Gait Pattern/deviations: Step-through pattern;Decreased stride length;Trunk flexed   Gait velocity interpretation: Below normal speed for age/gender General Gait Details: vc for safe use of RW  Stairs            Wheelchair Mobility    Modified Rankin (Stroke Patients Only)       Balance Overall balance assessment: Needs  assistance Sitting-balance support: No upper extremity supported;Feet supported Sitting balance-Leahy Scale: Fair     Standing balance support: Bilateral upper extremity supported Standing balance-Leahy Scale: Poor                               Pertinent Vitals/Pain SaO2 on 2L at rest 99% While walking decr to 84% on 2L (~20 seconds), educated in pursed lip breathing with return to 94% Dyspnea 2/4  Pain Assessment: 0-10 Pain Score: 5  Pain Location: sternum Pain Descriptors / Indicators: Sore Pain Intervention(s): Limited activity within patient's tolerance;Monitored during session;Premedicated before session;Repositioned    Home Living Family/patient expects to be discharged to:: Private residence Living Arrangements: Spouse/significant other Available Help at Discharge: Family;Available 24 hours/day Type of Home: House       Home Layout: One level Home Equipment: None      Prior Function Level of Independence: Independent               Hand Dominance        Extremity/Trunk Assessment   Upper Extremity Assessment: Overall WFL for tasks assessed (reviewed sternal precautions)           Lower Extremity Assessment: Overall WFL for tasks assessed      Cervical / Trunk Assessment: Normal  Communication   Communication: No difficulties  Cognition Arousal/Alertness: Awake/alert Behavior During Therapy: WFL for tasks assessed/performed Overall Cognitive Status: Within Functional Limits for tasks assessed                      General Comments  Exercises General Exercises - Lower Extremity Ankle Circles/Pumps: AROM;Both;10 reps      Assessment/Plan    PT Assessment Patient needs continued PT services  PT Diagnosis Difficulty walking;Acute pain   PT Problem List Decreased activity tolerance;Decreased balance;Decreased mobility;Decreased knowledge of use of DME;Decreased knowledge of precautions;Cardiopulmonary status  limiting activity;Obesity;Pain  PT Treatment Interventions DME instruction;Gait training;Functional mobility training;Therapeutic activities;Therapeutic exercise;Patient/family education   PT Goals (Current goals can be found in the Care Plan section) Acute Rehab PT Goals Patient Stated Goal: walk without device as PTA PT Goal Formulation: With patient Time For Goal Achievement: 10/25/14 Potential to Achieve Goals: Good    Frequency Min 3X/week   Barriers to discharge        Co-evaluation               End of Session Equipment Utilized During Treatment: Oxygen Activity Tolerance: Patient tolerated treatment well Patient left: in chair;with call bell/phone within reach Nurse Communication: Mobility status         Time: 1355-1425 PT Time Calculation (min) (ACUTE ONLY): 30 min   Charges:   PT Evaluation $Initial PT Evaluation Tier I: 1 Procedure PT Treatments $Gait Training: 8-22 mins   PT G Codes:        Sanela Evola 16-Nov-2014, 2:47 PM Pager [phone]

## 2014-10-18 NOTE — Progress Notes (Signed)
PT Cancellation Note  Patient Details Name: Harl FavorClyde Tuma MRN: [id number] DOB: 1940/09/02   Cancelled Treatment:    Reason Eval/Treat Not Completed: Patient at procedure or test/unavailable--pt in room undergoing sterile procedure. Will attempt later today.   Luciann Gossett 10/18/2014, 11:12 AM Pager [phone]

## 2014-10-19 ENCOUNTER — Inpatient Hospital Stay (HOSPITAL_COMMUNITY): Payer: Medicare Other

## 2014-10-19 LAB — CBC
HEMATOCRIT: 34.2 % — AB (ref 39.0–52.0)
Hemoglobin: 11.2 g/dL — ABNORMAL LOW (ref 13.0–17.0)
MCH: 28.1 pg (ref 26.0–34.0)
MCHC: 32.7 g/dL (ref 30.0–36.0)
MCV: 85.7 fL (ref 78.0–100.0)
Platelets: 103 10*3/uL — ABNORMAL LOW (ref 150–400)
RBC: 3.99 MIL/uL — ABNORMAL LOW (ref 4.22–5.81)
RDW: 14.7 % (ref 11.5–15.5)
WBC: 11.2 10*3/uL — ABNORMAL HIGH (ref 4.0–10.5)

## 2014-10-19 LAB — BASIC METABOLIC PANEL
ANION GAP: 9 (ref 5–15)
BUN: 26 mg/dL — AB (ref 6–20)
CO2: 25 mmol/L (ref 22–32)
Calcium: 8 mg/dL — ABNORMAL LOW (ref 8.9–10.3)
Chloride: 100 mmol/L — ABNORMAL LOW (ref 101–111)
Creatinine, Ser: 1.55 mg/dL — ABNORMAL HIGH (ref 0.61–1.24)
GFR calc Af Amer: 50 mL/min — ABNORMAL LOW (ref >60)
GFR, EST NON AFRICAN AMERICAN: 43 mL/min — AB (ref >60)
Glucose, Bld: 102 mg/dL — ABNORMAL HIGH (ref 65–99)
Potassium: 3.9 mmol/L (ref 3.5–5.1)
SODIUM: 134 mmol/L — AB (ref 135–145)

## 2014-10-19 LAB — GLUCOSE, CAPILLARY
Glucose-Capillary: 94 mg/dL (ref 65–99)
Glucose-Capillary: 109 mg/dL — ABNORMAL HIGH (ref 65–99)
Glucose-Capillary: 114 mg/dL — ABNORMAL HIGH (ref 65–99)
Glucose-Capillary: 108 mg/dL — ABNORMAL HIGH (ref 65–99)

## 2014-10-19 LAB — PROTIME-INR
INR: 1.24 (ref 0.00–1.49)
PROTHROMBIN TIME: 15.7 s — AB (ref 11.6–15.2)

## 2014-10-19 MED ORDER — WARFARIN SODIUM 2.5 MG PO TABS
2.5000 mg | ORAL_TABLET | Freq: Every day | ORAL | Status: DC
  Administered 2014-10-19: 2.5 mg via ORAL
  Filled 2014-10-19 (×2): qty 1

## 2014-10-19 MED ORDER — LACTULOSE 10 GM/15ML PO SOLN
20.0000 g | Freq: Every day | ORAL | Status: DC | PRN
  Filled 2014-10-19: qty 30

## 2014-10-19 NOTE — Progress Notes (Signed)
Received this pt to 2w35 at 1400.  Pt c/o loose stool late this afternoon as a probable result of laxative administration this morning.  Pt states he had had a loose stool after the laxative they had given him before, but he forgot to tell his nurse this morning when he took his laxative.  Will continue to monitor.

## 2014-10-19 NOTE — Progress Notes (Addendum)
      [address]       [place] [id number]             [phone]      3 Days Post-Op Procedure(s) (LRB): CANNULATION FOR CARDIOPULMONARY BYPASS (N/A) CLOSURE OF PATENT FORAMEN OVALE (N/A) PACEMAKER LEAD EXTRACTION (Right) PACEMAKER LEAD REMOVAL (N/A)   Subjective:  [name] complains of some pain this morning.    Objective: Vital signs in last 24 hours: Temp:  [97.6 F (36.4 C)-98.7 F (37.1 C)] 98.7 F (37.1 C) (05/28 0406) Pulse Rate:  [73-95] 84 (05/28 0406) Cardiac Rhythm:  [-] Normal sinus rhythm (05/27 1950) Resp:  [16-22] 16 (05/28 0406) BP: (108-133)/(64-90) 123/64 mmHg (05/28 0406) SpO2:  [91 %-97 %] 92 % (05/28 0406) Weight:  [257 lb 11.5 oz (116.9 kg)] 257 lb 11.5 oz (116.9 kg) (05/28 0600)  Intake/Output from previous day: 05/27 0701 - 05/28 0700 In: 1031.5 [P.O.:600; I.V.:381.5; IV Piggyback:50] Out: 700 [Urine:700] Intake/Output this shift: Total I/O In: -  Out: 300 [Urine:300]  General appearance: alert, cooperative and no distress Heart: regular rate and rhythm Lungs: clear to auscultation bilaterally Abdomen: soft, non-tender; bowel sounds normal; no masses,  no organomegaly Extremities: edema trace Wound: clean and dry  Lab Results:  Recent Labs  10/18/14 0358 10/19/14 0440  WBC 13.7* 11.2*  HGB 12.0* 11.2*  HCT 37.1* 34.2*  PLT 93* 103*   BMET:  Recent Labs  10/18/14 0358 10/19/14 0440  NA 136 134*  K 4.2 3.9  CL 102 100*  CO2 27 25  GLUCOSE 118* 102*  BUN 28* 26*  CREATININE 1.76* 1.55*  CALCIUM 7.6* 8.0*    PT/INR:  Recent Labs  10/18/14 0358  LABPROT 16.3*  INR 1.29   ABG    Component Value Date/Time   PHART 7.299* 10/17/2014 0208   HCO3 22.8 10/17/2014 0208   TCO2 22 10/17/2014 1713   ACIDBASEDEF 4.0* 10/17/2014 0208   O2SAT 93.0 10/17/2014 0208   CBG (last 3)   Recent Labs  10/18/14 1552 10/18/14 2141 10/19/14 0610  GLUCAP 118* 115* 94    Assessment/Plan: S/P Procedure(s)  (LRB): CANNULATION FOR CARDIOPULMONARY BYPASS (N/A) CLOSURE OF PATENT FORAMEN OVALE (N/A) PACEMAKER LEAD EXTRACTION (Right) PACEMAKER LEAD REMOVAL (N/A)  1. CV- NSR, rate in the 80s- tolerating beta blocker, will continue at low dose for now 2. Pulm- off oxygen, no acute issues, continue IS 3. Renal- CKD Stage III- creatinine trending down, continue to follow 4. ID- MSSA, Abx Day 7 will require 6 weeks of Ancef, PICC Line placed 5. Thrombocytopenia- stable, chronic  6. INR- not drawn despite being ordered, will continue Coumadin at 2.5 mg daily, however home dose was 5 mg 7. Dispo- patient stable, will d/c EPW, get PT/INR drawn via nursing, continue ABX   LOS: 9 days    BARRETT, ERIN 10/19/2014  Operative cultures of vegetation remain negative Patient need 6 weeks of IV Ancef for MSSA Continue slowly with Coumadin 2.5 mg daily, INR today 1.3 Agree with above assessment and plan  Kathlee NationsPeter Van trigt M.D.

## 2014-10-19 NOTE — Progress Notes (Signed)
10/19/2014 9:39 AM EPW D/C'd per order and per protocol.  Ends intact. Pt. Tolerated well.  Advised bedrest x1hr.  Call bell in reach.  Vital signs collected per protocol. Kathryne HitchAllen, Kenia Teagarden C

## 2014-10-19 NOTE — Progress Notes (Signed)
10/19/2014 11:23 AM Pt transferred to room 2w35.  Cardiac monitors switched so pt is now on box 35, CCMD notified of transfer.  Pt. Made comfortable in chair, oriented to room, call light and bed.  Call bell in reach, wife at bedside.  Will give bedside report to [place]. Kathryne HitchAllen, Providence Stivers C

## 2014-10-19 NOTE — Progress Notes (Signed)
CARDIAC REHAB PHASE I   PRE:  Rate/Rhythm: 84 SR  BP:  Supine: 85/68 rck 119/85 Sitting:   Standing:    SaO2: 94% RA  MODE:  Ambulation: 330 ft   POST:  Rate/Rhythm: 122  BP:  Supine:   Sitting: 112/71  Standing:    SaO2: 94% RA  1058-1134 Pt tolerated ambulation well with assist x1 and pushing a rolling walker, gait slow, steady. Two standing rest breaks taken. To chair after walk with legs elevated, call bell within reach, VSS.   Artist Paislinty M Melvern Ramone, MS, ACSM CCEP

## 2014-10-20 ENCOUNTER — Inpatient Hospital Stay (HOSPITAL_COMMUNITY): Payer: Medicare Other

## 2014-10-20 LAB — BASIC METABOLIC PANEL
Anion gap: 7 (ref 5–15)
BUN: 24 mg/dL — ABNORMAL HIGH (ref 6–20)
CO2: 26 mmol/L (ref 22–32)
Calcium: 8 mg/dL — ABNORMAL LOW (ref 8.9–10.3)
Chloride: 103 mmol/L (ref 101–111)
Creatinine, Ser: 1.32 mg/dL — ABNORMAL HIGH (ref 0.61–1.24)
GFR calc Af Amer: >60 mL/min (ref >60)
GFR calc non Af Amer: 52 mL/min — ABNORMAL LOW (ref >60)
Glucose, Bld: 106 mg/dL — ABNORMAL HIGH (ref 65–99)
Potassium: 3.7 mmol/L (ref 3.5–5.1)
Sodium: 136 mmol/L (ref 135–145)

## 2014-10-20 LAB — TISSUE CULTURE: Culture: NO GROWTH

## 2014-10-20 LAB — CBC
HCT: 33.3 % — ABNORMAL LOW (ref 39.0–52.0)
Hemoglobin: 11.1 g/dL — ABNORMAL LOW (ref 13.0–17.0)
MCH: 28.4 pg (ref 26.0–34.0)
MCHC: 33.3 g/dL (ref 30.0–36.0)
MCV: 85.2 fL (ref 78.0–100.0)
Platelets: 127 10*3/uL — ABNORMAL LOW (ref 150–400)
RBC: 3.91 MIL/uL — ABNORMAL LOW (ref 4.22–5.81)
RDW: 14.9 % (ref 11.5–15.5)
WBC: 9.2 10*3/uL (ref 4.0–10.5)

## 2014-10-20 LAB — GLUCOSE, CAPILLARY
Glucose-Capillary: 101 mg/dL — ABNORMAL HIGH (ref 65–99)
Glucose-Capillary: 92 mg/dL (ref 65–99)
Glucose-Capillary: 96 mg/dL (ref 65–99)
Glucose-Capillary: 125 mg/dL — ABNORMAL HIGH (ref 65–99)

## 2014-10-20 LAB — PROTIME-INR
INR: 1.24 (ref 0.00–1.49)
Prothrombin Time: 15.8 seconds — ABNORMAL HIGH (ref 11.6–15.2)

## 2014-10-20 MED ORDER — FUROSEMIDE 10 MG/ML IJ SOLN
40.0000 mg | Freq: Once | INTRAMUSCULAR | Status: AC
  Administered 2014-10-20: 40 mg via INTRAVENOUS

## 2014-10-20 MED ORDER — WARFARIN SODIUM 4 MG PO TABS
4.0000 mg | ORAL_TABLET | Freq: Every day | ORAL | Status: DC
  Administered 2014-10-20 – 2014-10-21 (×2): 4 mg via ORAL
  Filled 2014-10-20 (×3): qty 1

## 2014-10-20 NOTE — Progress Notes (Addendum)
      [address]       [place] [id number]             [phone]      4 Days Post-Op Procedure(s) (LRB): CANNULATION FOR CARDIOPULMONARY BYPASS (N/A) CLOSURE OF PATENT FORAMEN OVALE (N/A) PACEMAKER LEAD EXTRACTION (Right) PACEMAKER LEAD REMOVAL (N/A)   Subjective:  [name] complains of diarrhea.  He thinks this is related to Laxative use.  + ambulation  Objective: Vital signs in last 24 hours: Temp:  [98.5 F (36.9 C)-98.6 F (37 C)] 98.6 F (37 C) (05/29 0358) Pulse Rate:  [83-93] 87 (05/29 0358) Cardiac Rhythm:  [-] Normal sinus rhythm (05/29 0800) Resp:  [18-19] 18 (05/29 0358) BP: (102-134)/(66-90) 123/68 mmHg (05/29 0358) SpO2:  [94 %-95 %] 94 % (05/29 0358) Weight:  [255 lb 12.8 oz (116.03 kg)] 255 lb 12.8 oz (116.03 kg) (05/29 0358)  Intake/Output from previous day: 05/28 0701 - 05/29 0700 In: 110 [P.O.:100; I.V.:10] Out: 400 [Urine:400] Intake/Output this shift: Total I/O In: -  Out: 225 [Urine:225]  General appearance: alert, cooperative and no distress Heart: regular rate and rhythm Lungs: wheezes some audible wheezing with expiration Abdomen: soft, non-tender; bowel sounds normal; no masses,  no organomegaly Extremities: edema 1+ Wound: clean and dry  Lab Results:  Recent Labs  10/19/14 0440 10/20/14 0400  WBC 11.2* 9.2  HGB 11.2* 11.1*  HCT 34.2* 33.3*  PLT 103* 127*   BMET:  Recent Labs  10/19/14 0440 10/20/14 0400  NA 134* 136  K 3.9 3.7  CL 100* 103  CO2 25 26  GLUCOSE 102* 106*  BUN 26* 24*  CREATININE 1.55* 1.32*  CALCIUM 8.0* 8.0*    PT/INR:  Recent Labs  10/20/14 0400  LABPROT 15.8*  INR 1.24   ABG    Component Value Date/Time   PHART 7.299* 10/17/2014 0208   HCO3 22.8 10/17/2014 0208   TCO2 22 10/17/2014 1713   ACIDBASEDEF 4.0* 10/17/2014 0208   O2SAT 93.0 10/17/2014 0208   CBG (last 3)   Recent Labs  10/19/14 1622 10/19/14 2101 10/20/14 0559  GLUCAP 114* 108* 101*     Assessment/Plan: S/P Procedure(s) (LRB): CANNULATION FOR CARDIOPULMONARY BYPASS (N/A) CLOSURE OF PATENT FORAMEN OVALE (N/A) PACEMAKER LEAD EXTRACTION (Right) PACEMAKER LEAD REMOVAL (N/A)  1. CV- NSR, continue Beta Blocker 2. Pulm- some wheezing, not on oxygen, encouraged use of IS 3. Renal- creatinine continues to trend down, remains hypervolemic 19lbs above admission will give IV Lasix 4. GI- + diarrhea, will stop all stool softners, if continues can check C. Diff 5. ID- MSSA Day 8 ABX, Ancef for 6 weeks 6. INR 1.24 on 2.5 mg daily with no response, will increase to 4 mg daily 7. Dispo- patient stable, diarrhea after stool softner will monitor, may need to check C. Diff if doesn't improve, continue ABX, diuresis,    LOS: 10 days    Kenneth [name], ERIN 10/20/2014  Looks good on room air Agree with above assessment and plan- more diuresis No growth on pacing wire vegetation from OR patient examined and medical record reviewed,agree with above note. Kathlee Nationseter Van Trigt III 10/20/2014

## 2014-10-21 LAB — GLUCOSE, CAPILLARY
Glucose-Capillary: 118 mg/dL — ABNORMAL HIGH (ref 65–99)
Glucose-Capillary: 109 mg/dL — ABNORMAL HIGH (ref 65–99)
Glucose-Capillary: 110 mg/dL — ABNORMAL HIGH (ref 65–99)
Glucose-Capillary: 90 mg/dL (ref 65–99)

## 2014-10-21 LAB — ANAEROBIC CULTURE

## 2014-10-21 LAB — PROTIME-INR
INR: 1.16 (ref 0.00–1.49)
Prothrombin Time: 15 seconds (ref 11.6–15.2)

## 2014-10-21 NOTE — Op Note (Signed)
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  SURGICAL PROCEDURE:  Removal of large right atrial pacing wire vegetation and extraction of infected transvenous pacing leads with cardiopulmonary bypass and placement of left femoral arterial line with some slight ultrasound visualization.  SURGEON:  Sheliah Plane, MD  [name]. [name], MD  FIRST ASSISTANT:  Doree Fudge, PA  BRIEF HISTORY:  The patient is a 74 year old male who is approximately 8 months episodes of chills, had various evaluations reported as negative as the cause.  The patient 25 years previously had a pacemaker placed for syncopal episode.  Since that time, he had revision of the pacing leads including a new lead placed at [facility] and a generator in 2013.  Ultimately, an echocardiogram and a TEE was performed that demonstrated a large 3-4 cm vegetation at the level of the tricuspid valve attached to the pacing leads.  The patient was referred to Cardiac Surgery office for treatment, and first thing is the patient had been recently diagnosed with a pulmonary embolus at an outside hospital based on CT scan and was fully anticoagulated on Coumadin with an INR of 4. It was felt that the most expedient way to evaluate the patient stabilizing for admission is repeat cultures, Infectious Disease consultation and plan open surgical removal because of the size of the lesion.  It was felt that  the pulmonary embolus may actually have been related to embolization of portions of the vegetation.  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  The patient's coagulation status was corrected and it was recommended to him that we proceed with open extraction, he agreed and signed informed consent.  Preoperatively, he was known to have renal insufficiency with a baseline creatinine of 1.82.  The patient agrees to proceeding with surgical procedure and signed informed consent.  DESCRIPTION OF PROCEDURE:  With central line and arterial line in place, the patient underwent general endotracheal anesthesia.  The patient's arterial line was not reliable, and the left groin was prepped with Hibiclens, and using SonoSite, the left femoral artery was identified and a single-lumen central line was placed into the femoral artery with the correct femoral stick.  The patient was then prepped for sternotomy. TEE probe had been placed by Dr. Jacklynn Bue confirming the previous findings of a large vegetation attached to a ventricular lead.  We proceeded initially with opening the left supraclavicular area over the previously placed pacemaker dissecting down to the pacemaker pocket. The 2 leads, 1 was active and 1 that was capped were dissected free and the dissection carried down to the clavicle.  We then proceeded with sternotomy.  The pericardium was opened.  The patient was systemically heparinized.  The  ascending aorta was cannulated.  Caval tapes superior and inferior were placed.  Metal Tip right-angled venous cannulae were placed in the superior and inferior vena cava.  The patient was then placed on cardiopulmonary bypass.  His body temperature was allowed to drift down though we did not arrest the heart.  The caval tapes were secured and the right atrium was opened.  This gave us good visualization of the ventricular leads and a large vegetation.  A small sucker  was placed into the coronary sinus to keep the field clear.  We then gained control of the freely mobile vegetation and extracted both of the leads one with a passive fixation, other was active.  Previously, we had placed a guidewire down the active fixation lead and attempted to retract the fixation screw.  Both of the leads were removed from the right ventricle with the vegetation.  Portions of the vegetation were sent both for culture and for histology.  With the right atrium still open, the vegetations were removed to prevent any further embolization. We then attempted to remove the pacing leads.  Not unexpectedly these leads were still adherent in the proximal left subclavian vein and innominate vein.  At this point, Dr. Ladona Ridgelaylor used an over the lead cutting device over each of the leads and freed the muscles and they were then pulled down from the left subclavian pocket and removed.  With the leads completely removed and the generator removed, we then closed the right atriotomy with the horizontal mattress 4-0 Prolene suture allowing the right side of the heart to slowly fill and de-air.  The caval tapes were released.  The patient's body temperature had been rewarmed to 37 degrees.  He continued in sinus rhythm.  Atrial and ventricular pacing wires were applied, and the inferior vena caval cannula was removed, and the patient was then ventilated and weaned from cardiopulmonary bypass without difficulty.  He remained hemodynamically stable.  He was decannulated in usual fashion.  Protamine sulfate was administered.  Two Blake drains were left in the pericardium.  The pericardium was loosely reapproximated.  Sternum was closed with #6 stainless steel wire.  Fascia was closed with interrupted 0 Vicryl and 3- 0 Vicryl subcutaneous tissue, 3-0 subcuticular stitch in skin edges. Dry dressings were applied.  Sponge and needle count was reported as correct at the completion of procedure.   The patient tolerated the procedure without obvious complication.  The patient was transferred to Surgical Intensive Care unit having tolerated the procedure.  Total pump time 91 minutes.     Sheliah PlaneEdward Dorthia Tout, MD     EG/MEDQ  D:  10/20/2014  T:  10/21/2014  Job:  [id number]

## 2014-10-21 NOTE — Discharge Summary (Signed)
Physician Discharge Summary  Patient ID: Stephens Shreve MRN: [id number] DOB/AGE: 73-Oct-1942 73 y.o.  Admit date: 10/10/2014 Discharge date: 10/21/2014  Admission Diagnoses: Endocarditis  Discharge Diagnoses:  Principal Problem:   Acute endocarditis Active Problems:   CAD- remote "ruptured coronary"   PACEMAKER, PERMANENT   Obesity- BMI 42   Pacemaker infection   Chronic renal insufficiency, stage III (moderate)   Benign essential HTN   Obstructive apnea- C-pap intol   Endocarditis  Patient Active Problem List   Diagnosis Date Noted  . Endocarditis 10/16/2014  . Acute endocarditis 10/10/2014  . Pacemaker infection 10/09/2014  . Chronic diastolic heart failure 09/27/2014  . Acute diastolic heart failure 09/23/2014  . History of complete heart block 09/23/2014  . Encounter for therapeutic drug monitoring 09/02/2014  . PE (pulmonary embolism) 08/23/2014  . Chronic anxiety 07/26/2014  . Diabetes mellitus, type 2 04/01/2014  . COPD, moderate 04/01/2014  . Chronic renal insufficiency, stage III (moderate) 04/01/2014  . Change in blood platelet count 01/25/2014  . Obstructive apnea- C-pap intol 10/17/2013  . Benign fibroma of prostate 07/09/2013  . Routine general medical examination at a health care facility 07/12/2012  . Complete heart block-intermittent 06/09/2011  . Obesity- BMI 42 07/20/2010  . SYNCOPE 07/20/2010  . Arteriosclerosis of coronary artery 06/05/2010  . Acid reflux 06/05/2010  . HYPERLIPIDEMIA 08/14/2008  . CAD- remote "ruptured coronary" 08/14/2008  . PACEMAKER, PERMANENT 08/14/2008  . Arthritis, degenerative 05/28/2004  . Benign essential HTN 09/18/2002  . Acquired complete AV block 04/27/1993  . Hypercholesteremia 04/27/1993   History of present illness:  The patient is a 74 year old male with a history of syncope 25 years ago resulting in a placement of pacemaker. Since that time he has had the generator revised twice, the most recent 2013 by Dr.  Graciela Husbands. Previous revision was at [facility], patient's wife notes that he attempted to remove the leads which was unsuccessful and new lead was placed. The patient recently developed symptoms of chills and rigors episodically. He denied any definite known fevers. He has had episodes of shortness of breath precipitating admissions at [facility] and a more recent admission to [facility] on 09/24/2014. At that time he was admitted with a white count of 22,000 and platelet count of 88,000. He was treated with sterile avoidance. One blood culture was positive for staph epi. A TEE on 5 5 showed a 3 cm vegetation on the ventricular lead at the level of the tricuspid valve and a large PFO. The patient was discharged home but returned this hospitalization for removal. He also has a recent CT scan at [facility] which revealed a pulmonary embolus and he was started on Coumadin. He gets his PTT/INR checked by Dr. Reinaldo Raddle in [place], [place]. The patient has had a previous myocardial infarction treated at [facility] with attempted cyst stent which "ruptured the artery" requiring a 22 day hospitalization although he is very vague on the details.  He is also noted have a recent evaluation for his thrombocytopenia with a bone marrow aspirate revealing trilineage hematopoiesis and no atypical infiltrates or metastatic carcinoma.   Discharged Condition: good  Hospital Course: The patient was admitted endocarditis, with PFO and large vegetation on the pacer lead at the level of the tricuspid valve. He was admitted for cultures, antibody and cardiology/infectious disease consultations prior to proceeding with surgical removal of the lead and closure of the PFO on cardiopulmonary bypass. He had a perfusion study which revealed abnormal peripheral perfusion  with a fixed inferior defect that represents scar or diaphragmatic attenuation. This was felt to be a low risk study. Overall left ventricular systolic  function was normal. LV cavity size is normal. The left ventricular ejection fraction is mildly decreased 45-54%. Infectious disease placed the patient on 3 antibiotics  including Rocephin,  rifampin, and the vancomycin. On 10/16/2014 he was felt to be acceptable for proceeding with surgery and underwent the following procedure:  DATE OF PROCEDURE: 10/16/2014 DATE OF DISCHARGE:   OPERATIVE REPORT   PREOPERATIVE DIAGNOSIS: Endocarditis associated with large vegetations on previously placed transvenous pacing wires.  POSTOPERATIVE DIAGNOSIS: Endocarditis associated with large vegetations on previously placed transvenous pacing wires.  SURGICAL PROCEDURE: Removal of large right atrial pacing wire vegetation and extraction of infected transvenous pacing leads with cardiopulmonary bypass and placement of left femoral arterial line with some slight ultrasound visualization.  SURGEON: Sheliah PlaneEdward Gerhardt, MD  Mammie LorenzoO-SURGEONDoylene Canning: Gregg W. Ladona Ridgelaylor, MD  FIRST ASSISTANT: Doree Fudgeonielle Zimmerman, PA The patient tolerated the procedure without obvious complication. The patient was transferred to Surgical Intensive Care unit having tolerated the procedure. Total pump time 91 minutes.   Postoperative hospital course:  The patient has overall done quite well. He is maintained stable hemodynamics. He has been placed on intravenous and about its for M S SA and plans are for 6 weeks of total intravenous Ancef. He has a PICC line placed. He has been restarted on Coumadin. He did have a slight bump in his creatinine which has improved.   his most recent is 1.24 on 10/22/2014. His blood sugars have been under good control during hospitalization but he will need primary care follow-up for his non-insulin-dependent diabetes as an outpatient. Incisions are healing well without evidence of infection. He does have some mild volume overload but is improved on diaphoretic's. He is  tolerating routine activities including ambulation without significant difficulty. He did have a short course of diarrhea postoperatively but this resolved with discontinuation of laxitives. He is overall deemed to be quite stable for discharge on today's date.   Consults: cardiology                   Infectious disease   Significant Diagnostic Studies: nuclear medicine scan  Treatments: surgery: as above  Medications at time of discharge:    Medication List    STOP taking these medications        atenolol 100 MG tablet  Commonly known as:  TENORMIN      TAKE these medications        aspirin 325 MG EC tablet  Take 1 tablet (325 mg total) by mouth daily.     ceFAZolin 2-3 GM-% Solr  Commonly known as:  ANCEF  Inject 50 mLs (2 g total) into the vein every 8 (eight) hours.     furosemide 20 MG tablet  Commonly known as:  LASIX  Take 40 mg by mouth every morning.     losartan 50 MG tablet  Commonly known as:  COZAAR  Take 1 tablet by mouth daily.     metoprolol tartrate 25 MG tablet  Commonly known as:  LOPRESSOR  Take 1 tablet (25 mg total) by mouth 2 (two) times daily.     oxyCODONE 5 MG immediate release tablet  Commonly known as:  Oxy IR/ROXICODONE  Take 1-2 tablets (5-10 mg total) by mouth every 6 (six) hours as needed for severe pain.     ranitidine 150 MG tablet  Commonly known as:  ZANTAC  Take 150 mg by mouth every morning.     warfarin 5 MG tablet  Commonly known as:  COUMADIN  Take 5 mg by mouth daily at 6 PM.        Discharge Exam: Blood pressure 104/87, pulse 95, temperature 98.9 F (37.2 C), temperature source Oral, resp. rate 18, height 5' (1.524 m), weight 252 lb 14.4 oz (114.715 kg), SpO2 95 %.   General appearance: alert, cooperative and no distress Heart: regular rate and rhythm Lungs: clear to auscultation bilaterally Abdomen: benign Extremities: mild edema Wound: incis healing well    Disposition: 01-Home or Self Care     Follow-up Information    Follow up with Lewayne Bunting, MD On 11/14/2014.   Specialty:  Cardiology   Why:  at 9:30AM   Contact information:   [address] [place] [address] [phone]       Follow up with Delight Ovens, MD.   Specialty:  Cardiothoracic Surgery   Why:  Appt to see surgeon will be arranged. Please obtain a chest x-ray [facility] imaging 1 hour prior to this appointment. [facility] is located in the same office complex.   Contact information:   [address] [place] [address] [phone]       Follow up with Jerene Pitch, MD.   Specialty:  Internal Medicine   Why:    PT/INR blood test midweek to adjust Coumadin dose.   Contact information:   [facility] [address] [place] [address] [phone]       Signed: Rowe Clack 10/21/2014, 3:49 PM

## 2014-10-21 NOTE — Progress Notes (Signed)
Pt ambulated 1000 ft with no assistance. Hiram Combererek Quamel Fitzmaurice RN

## 2014-10-21 NOTE — Progress Notes (Signed)
Physical Therapy Treatment Patient Details Name: Kenneth [name] MRN: [id number] DOB: 05/04/1941 Today's Date: 10/21/2014    History of Present Illness Adm 10/10/14 with endocarditis. 5/25 underwent removal of lead wires to PPM and PFO closure. PMHx- Lt eye removal, MI, OA, DM    PT Comments    Pt progressing towards physical therapy goals. Was able to show increased independence with mobility, however continues to require frequent cueing for maintaining sternal precautions. Feel pt would benefit from continued therapy services at d/c - HHPT initially with progression to a cardiac rehab or outpatient PT program would be appropriate. Discussed with pt and wife and they are in agreement.   Follow Up Recommendations  Supervision for mobility/OOB;Home health PT     Equipment Recommendations  None recommended by PT    Recommendations for Other Services       Precautions / Restrictions Precautions Precautions: Sternal;Fall Restrictions Weight Bearing Restrictions: Yes (Sternal precautions)    Mobility  Bed Mobility               General bed mobility comments: Pt sitting up in bedside chair when PT arrived.   Transfers Overall transfer level: Needs assistance Equipment used: None Transfers: Sit to/from Stand Sit to Stand: Min guard         General transfer comment: VC's for holding pillow to stand as pt was initially using UE's to push off of chair. Hands-on guarding for balance.   Ambulation/Gait Ambulation/Gait assistance: Min guard;Supervision Ambulation Distance (Feet): 200 Feet Assistive device: None (Holding IV pole) Gait Pattern/deviations: Step-through pattern;Decreased stride length;Trunk flexed Gait velocity: Decreased Gait velocity interpretation: Below normal speed for age/gender General Gait Details: VC's for general safety awareness and improved posture   Stairs Pt declined stair training at this time, however pt and wife were educated on sequencing  and general safety awareness while ascending the stairs.             Wheelchair Mobility    Modified Rankin (Stroke Patients Only)       Balance Overall balance assessment: Needs assistance Sitting-balance support: Feet supported;No upper extremity supported Sitting balance-Leahy Scale: Fair     Standing balance support: No upper extremity supported Standing balance-Leahy Scale: Fair                      Cognition Arousal/Alertness: Awake/alert Behavior During Therapy: WFL for tasks assessed/performed Overall Cognitive Status: Within Functional Limits for tasks assessed                      Exercises General Exercises - Lower Extremity Long Arc Quad: 15 reps    General Comments        Pertinent Vitals/Pain Pain Assessment: No/denies pain    Home Living                      Prior Function            PT Goals (current goals can now be found in the care plan section) Acute Rehab PT Goals Patient Stated Goal: walk without device as PTA PT Goal Formulation: With patient Time For Goal Achievement: 10/25/14 Potential to Achieve Goals: Good Progress towards PT goals: Progressing toward goals    Frequency  Min 3X/week    PT Plan Current plan remains appropriate    Co-evaluation             End of Session Equipment Utilized During Treatment: Gait belt Activity Tolerance: Patient  tolerated treatment well Patient left: in chair;with call bell/phone within reach     Time: 1500-1520 PT Time Calculation (min) (ACUTE ONLY): 20 min  Charges:  $Gait Training: 8-22 mins                    G Codes:      Conni Slipper Nov 07, 2014, 3:28 PM   Conni Slipper, PT, DPT Acute Rehabilitation Services Pager: [phone]

## 2014-10-21 NOTE — Discharge Instructions (Signed)
STERNOTOMY DISCHARGE INSTRUCTIONS *If your chest was cut open for open heart surgery, the incision down the middle of your chest is called a sternotomy. *Surgery will take a lot of strength and energy out of you. You may tire easily, but this will slowly and steadily improve over the following weeks. Diet: Eat a heart healthy, low salt diet. Maintain a balanced diet and remember your body needs protein to heal. Foods high in protein are meats (limit red meat), lean meats are best, chicken, fish, beans and legumes (this includes hummus), and dairy products (milks and cheeses). Be sure to eat plenty fruits and vegetables. They contain lots of vitamins and minerals. *If you were on a special diet for any reason before surgery, please resume that diet. Restrictions: No lifting more than 5-8 lbs for 2 months (FYI: 1 gallon of milk weighs approximately 8 lbs). Do not drive any automobile, truck, tractor, boat, or ATV for 1 month. Even if you are not taking pain medications, the effects of anesthesia can still last. Activity and Exercise: Shower daily. Do not rub, scrub, or soak incision. Walk at least 3 times every day. Start by walking a comfortable amount of time (6-8 minutes) and increase by 1 minute every day. Keep a record of this and build from it. You can walk anywhere youd like (inside, outside, treadmills, stairs, etc). You cannot walk too much if you are feeling good! Be cautious of the weather (extremely hot or cold days) if you are walking outside. You may tire more easily than you would expect. Remember to pace yourself. Rest when you are tired. TED hose (stockings) are to be used, as needed, for leg swelling. Wear these during the day, but take off at night. Relaxation and deep breathing exercises will help your anxiety and strengthen your lungs. Continue to use your incentive spirometer 10x an hour, while awake. Do not do any heavy lifting, chin ups, pull ups, or push ups until  cleared by surgeon during his follow-up appointment. These activities may keep your sternum from healing. Sex: Resuming sexual activity is generally fine. However, do not strain or put pressure on your sternotomy. Generally speaking if youre concerned, refrain for one month. Wound care:  Wash incision site with soap and water. Pat dry. Do not soak in bathtub. Groin care: keep clean and dry. Clean with rubbing alcohol, as needed. After cleaning or showering, pat dry. If you need to cover it with a dry dressing in order for it to stay dry, then you may do so.  Showering daily is fine. Medications: Take all medications as directed. When taking pain medications, try not to wait and take it until the pain is very severe. Rather, take it at the first sign of pain. Do not drive while on pain medications. Sleeping: Try to get at least 7 hours of sleep at night. Your body needs this to heal. Immediately after surgery, you may have trouble sleeping longer than a few hours at a time. You may sleep in any position that is most comfortable. Constipation: Constipation is often a problem after surgery and is complicated by anesthesia and pain medications. Use an over the counter stool softener, if needed, as directed. You may also consider a mild laxative such as milk of magnesia or Metamucil.

## 2014-10-21 NOTE — Progress Notes (Addendum)
[name]       [place] [id number]             [phone]      5 Days Post-Op Procedure(s) (LRB): CANNULATION FOR CARDIOPULMONARY BYPASS (N/A) CLOSURE OF PATENT FORAMEN OVALE (N/A) PACEMAKER LEAD EXTRACTION (Right) PACEMAKER LEAD REMOVAL (N/A) Subjective: Feels well, diarrhea has resolved with stopping laxatives   Objective: Vital signs in last 24 hours: Temp:  [98.8 F (37.1 C)-99.6 F (37.6 C)] 98.9 F (37.2 C) (05/30 0400) Pulse Rate:  [95-98] 95 (05/30 0400) Cardiac Rhythm:  [-] Normal sinus rhythm (05/30 0850) Resp:  [16-18] 18 (05/30 0400) BP: (104-135)/(69-87) 104/87 mmHg (05/30 0400) SpO2:  [95 %] 95 % (05/30 0400) Weight:  [252 lb 14.4 oz (114.715 kg)] 252 lb 14.4 oz (114.715 kg) (05/30 0400)  Hemodynamic parameters for last 24 hours:    Intake/Output from previous day: 05/29 0701 - 05/30 0700 In: 490 [P.O.:480; I.V.:10] Out: 1325 [Urine:1325] Intake/Output this shift: Total I/O In: 240 [P.O.:240] Out: -   General appearance: alert, cooperative and no distress Heart: regular rate and rhythm Lungs: clear to auscultation bilaterally Abdomen: obese, bon-tender Extremities: + LE edema Wound: incis healing well  Lab Results:  Recent Labs  10/19/14 0440 10/20/14 0400  WBC 11.2* 9.2  HGB 11.2* 11.1*  HCT 34.2* 33.3*  PLT 103* 127*   BMET:  Recent Labs  10/19/14 0440 10/20/14 0400  NA 134* 136  K 3.9 3.7  CL 100* 103  CO2 25 26  GLUCOSE 102* 106*  BUN 26* 24*  CREATININE 1.55* 1.32*  CALCIUM 8.0* 8.0*    PT/INR:  Recent Labs  10/21/14 0455  LABPROT 15.0  INR 1.16   ABG    Component Value Date/Time   PHART 7.299* 10/17/2014 0208   HCO3 22.8 10/17/2014 0208   TCO2 22 10/17/2014 1713   ACIDBASEDEF 4.0* 10/17/2014 0208   O2SAT 93.0 10/17/2014 0208   CBG (last 3)   Recent Labs  10/20/14 2058 10/21/14 0618 10/21/14 1153  GLUCAP 125* 118* 109*    Meds Scheduled Meds: . aspirin EC  325 mg Oral  Daily  .  ceFAZolin (ANCEF) IV  2 g Intravenous 3 times per day  . enoxaparin (LOVENOX) injection  30 mg Subcutaneous Q24H  . famotidine  20 mg Oral BID  . insulin aspart  0-24 Units Subcutaneous TID AC & HS  . insulin detemir  10 Units Subcutaneous Daily  . metoprolol tartrate  12.5 mg Oral BID  . sodium chloride  3 mL Intravenous Q12H  . warfarin  4 mg Oral q1800  . Warfarin - Physician Dosing Inpatient   Does not apply q1800   Continuous Infusions:  PRN Meds:.sodium chloride, alum & mag hydroxide-simeth, morphine injection, ondansetron **OR** ondansetron (ZOFRAN) IV, oxyCODONE, sodium chloride, sodium chloride, traMADol  Xrays Dg Chest 2 View  10/20/2014   CLINICAL DATA:  SOB on exertion.H/o HTN, diabetes, COPD, pacemaker lead removal on 5. 25. 26 due to infection  EXAM: CHEST  2 VIEW  COMPARISON:  10/19/2014  FINDINGS: Changes from cardiac surgery are stable. Cardiac silhouette is top-normal in size. No mediastinal or hilar masses or evidence of adenopathy.  Mild lung base opacity most likely atelectasis. No convincing pneumonia or pulmonary edema. Small effusions. No pneumothorax.  Left PICC is well positioned with its tip near the caval atrial junction.  IMPRESSION: No acute cardiopulmonary disease. Small pleural effusions and mild lung base atelectasis.   Electronically Signed  By: Amie Portland M.D.   On: 10/20/2014 09:04    Assessment/Plan: S/P Procedure(s) (LRB): CANNULATION FOR CARDIOPULMONARY BYPASS (N/A) CLOSURE OF PATENT FORAMEN OVALE (N/A) PACEMAKER LEAD EXTRACTION (Right) PACEMAKER LEAD REMOVAL (N/A)  LOS: 11 days   1 hemodyn stable , sinus rhythm, tmax 99.6 2 cont current abx- plan for 6 weeks 3 INR conts to decrease - dose was increased yesterday 4 cbg's well controlled- was not on insulin at home- HGB A!C 7.0- may need oral agent 5 push rehab/pulm toilet as able Kenneth [name],Kenneth [name] 10/21/2014  No growth on intraoperative vegetation removed with pacing leads Patient  getting stronger still needs more diuresis Should be ready for discharge home one to-2 days with plans for home IV antimicrobials for MSSA in blood

## 2014-10-21 NOTE — Care Management Note (Signed)
Case Management Note  Patient Details  Name: Harl FavorClyde Hoeschen MRN: [id number] Date of Birth: 1941-01-09  Subjective/Objective:   [place].  Will need IV antibiotics for 6 weeks.  Will need HH RN and IV antibiotics on discharge, wife willing and able to assist with IV antibiotics at home.  Wife states may be able to be discharge on 10-22-14.  [facility] [place] that can do these services.  CM will continue to follow.  [facility] [place] may be able to do this - [phone] - waiting for phone call back.                  Action/Plan:   Expected Discharge Date:  10/21/14               Expected Discharge Plan:  Home w Home Health Services  In-House Referral:     Discharge planning Services  CM Consult  Post Acute Care Choice:    Choice offered to:     DME Arranged:    DME Agency:     HH Arranged:    HH Agency:     Status of Service:  In process, will continue to follow  Medicare Important Message Given:    Date Medicare IM Given:    Medicare IM give by:    Date Additional Medicare IM Given:    Additional Medicare Important Message give by:     If discussed at Long Length of Stay Meetings, dates discussed:    Additional Comments:  Vangie BickerBrown, Hashim Eichhorst Jane, RN 10/21/2014, 3:15 PM

## 2014-10-22 LAB — BASIC METABOLIC PANEL
Anion gap: 10 (ref 5–15)
BUN: 15 mg/dL (ref 6–20)
CO2: 25 mmol/L (ref 22–32)
Calcium: 8.3 mg/dL — ABNORMAL LOW (ref 8.9–10.3)
Chloride: 101 mmol/L (ref 101–111)
Creatinine, Ser: 1.24 mg/dL (ref 0.61–1.24)
GFR calc Af Amer: >60 mL/min (ref >60)
GFR calc non Af Amer: 56 mL/min — ABNORMAL LOW (ref >60)
Glucose, Bld: 105 mg/dL — ABNORMAL HIGH (ref 65–99)
Potassium: 3.8 mmol/L (ref 3.5–5.1)
Sodium: 136 mmol/L (ref 135–145)

## 2014-10-22 LAB — PROTIME-INR
INR: 1.21 (ref 0.00–1.49)
Prothrombin Time: 15.4 seconds — ABNORMAL HIGH (ref 11.6–15.2)

## 2014-10-22 LAB — GLUCOSE, CAPILLARY: Glucose-Capillary: 106 mg/dL — ABNORMAL HIGH (ref 65–99)

## 2014-10-22 MED ORDER — METOPROLOL TARTRATE 25 MG PO TABS: 25.0000 mg | ORAL_TABLET | Freq: Two times a day (BID) | ORAL | Status: DC

## 2014-10-22 MED ORDER — ASPIRIN 325 MG PO TBEC: 325.0000 mg | DELAYED_RELEASE_TABLET | Freq: Every day | ORAL | Status: DC

## 2014-10-22 MED ORDER — OXYCODONE HCL 5 MG PO TABS: 5.0000 mg | ORAL_TABLET | Freq: Four times a day (QID) | ORAL | Status: DC | PRN

## 2014-10-22 MED ORDER — CEFAZOLIN SODIUM-DEXTROSE 2-3 GM-% IV SOLR: 2.0000 g | Freq: Three times a day (TID) | INTRAVENOUS | Status: DC

## 2014-10-22 MED ORDER — HEPARIN SOD (PORK) LOCK FLUSH 100 UNIT/ML IV SOLN
250.0000 [IU] | INTRAVENOUS | Status: AC | PRN
  Administered 2014-10-22: 250 [IU]

## 2014-10-22 MED ORDER — COUMADIN BOOK
Freq: Once | Status: AC
  Administered 2014-10-22: 10:00:00
  Filled 2014-10-22: qty 1

## 2014-10-22 NOTE — Progress Notes (Signed)
      [address]       [place] [id number]             [phone]      6 Days Post-Op Procedure(s) (LRB): CANNULATION FOR CARDIOPULMONARY BYPASS (N/A) CLOSURE OF PATENT FORAMEN OVALE (N/A) PACEMAKER LEAD EXTRACTION (Right) PACEMAKER LEAD REMOVAL (N/A) Subjective: Feels well , no new complaints  Objective: Vital signs in last 24 hours: Temp:  [98.5 F (36.9 C)-99.2 F (37.3 C)] 99.2 F (37.3 C) (05/31 0535) Pulse Rate:  [93-102] 102 (05/31 0535) Cardiac Rhythm:  [-] Normal sinus rhythm (05/30 2000) Resp:  [17-18] 18 (05/31 0535) BP: (136-141)/(77-81) 136/77 mmHg (05/31 0535) SpO2:  [93 %-97 %] 93 % (05/31 0535) Weight:  [252 lb 6.4 oz (114.488 kg)] 252 lb 6.4 oz (114.488 kg) (05/31 0535)  Hemodynamic parameters for last 24 hours:    Intake/Output from previous day: 05/30 0701 - 05/31 0700 In: 240 [P.O.:240] Out: 225 [Urine:225] Intake/Output this shift:    General appearance: alert, cooperative and no distress Heart: regular rate and rhythm Lungs: clear to auscultation bilaterally Abdomen: benign Extremities: mild edema Wound: incis healing well  Lab Results:  Recent Labs  10/20/14 0400  WBC 9.2  HGB 11.1*  HCT 33.3*  PLT 127*   BMET:  Recent Labs  10/20/14 0400 10/22/14 0530  NA 136 136  K 3.7 3.8  CL 103 101  CO2 26 25  GLUCOSE 106* 105*  BUN 24* 15  CREATININE 1.32* 1.24  CALCIUM 8.0* 8.3*    PT/INR:  Recent Labs  10/22/14 0530  LABPROT 15.4*  INR 1.21   ABG    Component Value Date/Time   PHART 7.299* 10/17/2014 0208   HCO3 22.8 10/17/2014 0208   TCO2 22 10/17/2014 1713   ACIDBASEDEF 4.0* 10/17/2014 0208   O2SAT 93.0 10/17/2014 0208   CBG (last 3)   Recent Labs  10/21/14 1624 10/21/14 2101 10/22/14 0621  GLUCAP 110* 90 106*    Meds Scheduled Meds: . aspirin EC  325 mg Oral Daily  .  ceFAZolin (ANCEF) IV  2 g Intravenous 3 times per day  . enoxaparin (LOVENOX) injection  30 mg Subcutaneous Q24H  .  famotidine  20 mg Oral BID  . insulin aspart  0-24 Units Subcutaneous TID AC & HS  . insulin detemir  10 Units Subcutaneous Daily  . metoprolol tartrate  12.5 mg Oral BID  . sodium chloride  3 mL Intravenous Q12H  . warfarin  4 mg Oral q1800  . Warfarin - Physician Dosing Inpatient   Does not apply q1800   Continuous Infusions:  PRN Meds:.sodium chloride, alum & mag hydroxide-simeth, morphine injection, ondansetron **OR** ondansetron (ZOFRAN) IV, oxyCODONE, sodium chloride, sodium chloride, traMADol  Xrays No results found.  Assessment/Plan: S/P Procedure(s) (LRB): CANNULATION FOR CARDIOPULMONARY BYPASS (N/A) CLOSURE OF PATENT FORAMEN OVALE (N/A) PACEMAKER LEAD EXTRACTION (Right) PACEMAKER LEAD REMOVAL (N/A)  1 doing well 2 INR slow to rise , will increase to home dose 3 cont IV abx- home tx to be arranged 4 creat improved 5 cbg's ok   LOS: 12 days    [name],[name] 10/22/2014

## 2014-10-22 NOTE — Progress Notes (Signed)
1023 Checked with IV RN. Pt not to shower with PICC line in. Notified pt and his wife that he cannot shower due to risk of getting wet and infection occuring. Luetta NuttingCharlene Chaunice Obie RN BSN 10/22/2014 10:24 AM

## 2014-10-22 NOTE — Progress Notes (Signed)
[id number] Pt for discharge. Education completed with pt and wife. Encouraged IS. Pt has PICC line and RN to address if pt can shower with it in or not. Gave diabetic diet for reference and discussed carb counting. Pt on Coumadin and needs booklet. One ordered for him. Put on discharge video for them to view and encouraged them to watch Coumadin one after that. Pt stated did not need walker for home.  Luetta NuttingCharlene Blessing Ozga RN BSN 10/22/2014 9:48 AM

## 2014-10-22 NOTE — Care Management Note (Addendum)
Case Management Note  Patient Details  Name: Kenneth [name] MRN: [id number] Date of Birth: 02-09-1941  Subjective/Objective:          Endocarditis        Action/Plan: Lives at home with wife[facility]  Expected Discharge Date:  10/22/14               Expected Discharge Plan:  Home w Home Health Services  In-House Referral:     Discharge planning Services  CM Consult  Post Acute Care Choice:  Home Health Choice offered to:  Spouse  DME Arranged:    DME Agency:     HH Arranged:  RN, PT HH Agency:  [facility], Other - See comment  Status of Service:   complete  Medicare Important Message Given:  Yes Date Medicare IM Given:  10/22/14 Medicare IM give by:  Isidoro DonningAlesia Jenilyn Magana RN CCM  Date Additional Medicare IM Given:    Additional Medicare Important Message give by:     If discussed at Long Length of Stay Meetings, dates discussed:    Additional Comments:   10/22/2014 1115 NCM spoke to pt about copay cost for IV abx. Pt will have $18-20 copay for med and $10 per diem cost. Pt's wife states they can afford at this time. HH RN will be out today at 2 pm to administer 2 pm dose. Isidoro DonningAlesia Jakala Herford RN CCM Case Mgmt phone [phone]  10/22/2014, 9:54 AM NCM spoke to pt and gave permission to speak to wife, Mrs Ty Hiltsdmonds. Explained HH and receiving IV abx at home. Pt is on a 6 am, 2pm and 10pm schedule for home. Wife is teachable and will be able to administer doses at home. [facility], Marveen ReeksAdam Bracken, # [phone] and they will work with [facility] to sep IV abx at home. Faxed orders, dc summary, demographics, IV abx RX and [facility] # [phone].  Elliot CousinShavis, Joellen Tullos Ellen, RN 10/22/2014, 9:54 AM

## 2014-10-23 NOTE — Care Management Note (Signed)
Case Management Note  Patient Details  Name: Kenneth [name] MRN: [id number] Date of Birth: 11/15/1940  Subjective/Objective:                    Action/Plan:   Expected Discharge Date:  10/22/14               Expected Discharge Plan:  Home w Home Health Services  In-House Referral:     Discharge planning Services  CM Consult  Post Acute Care Choice:  Home Health Choice offered to:  Spouse  DME Arranged:    DME Agency:     HH Arranged:  RN, PT HH Agency:  [facility], Other - See comment  Status of Service:     Medicare Important Message Given:  Yes Date Medicare IM Given:  10/22/14 Medicare IM give by:  Isidoro DonningAlesia Shavis RN CCM  Date Additional Medicare IM Given:    Additional Medicare Important Message give by:     If discussed at Long Length of Stay Meetings, dates discussed:  10/22/2014  Additional Comments:  Yvone Neurutchfield, Dmari Schubring M, RN 10/23/2014, 9:39 AM

## 2014-10-25 ENCOUNTER — Encounter: Payer: Self-pay | Admitting: Internal Medicine

## 2014-11-01 ENCOUNTER — Telehealth: Payer: Self-pay | Admitting: *Deleted

## 2014-11-01 ENCOUNTER — Other Ambulatory Visit: Payer: Self-pay | Admitting: *Deleted

## 2014-11-01 DIAGNOSIS — G5691 Unspecified mononeuropathy of right upper limb: Secondary | ICD-10-CM

## 2014-11-01 MED ORDER — TRAMADOL HCL 50 MG PO TABS: 50.0000 mg | ORAL_TABLET | Freq: Four times a day (QID) | ORAL | Status: DC | PRN

## 2014-11-01 NOTE — Telephone Encounter (Signed)
Kenneth [name] home health nurse called to relay a 3-4 day h/o pain, numbness, tingling in his right hand and lower arm. It is a pretty constant discomfort, particularly at night.  All vital signs are stable.  His PIC line is in his left arm and right radial pulse is present. He has some very slight swelling of his hand.  I called and discussed this with [name] and he advised that he see someone near him to r/o a vascular problem if no pulse could be obtained. Otherwise he could see his medical doctor.  I called and talked to he and his wife. They have an urgent care near them but seem reluctant to go.  Also, they said they called their PCP, but could not been very soon.  I suggested taking the Oxycodone that he had prescribed for pain, but that causes severe nervousness. I offered to get a Tramadol rx ordered and faxed to their pharmacy and see one of our P.A.'s on Monday.  They readily accepted this. An appointment was made and a Tramadol rx was faxed to his pharmacy.

## 2014-11-04 ENCOUNTER — Ambulatory Visit (INDEPENDENT_AMBULATORY_CARE_PROVIDER_SITE_OTHER): Payer: Self-pay | Admitting: Physician Assistant

## 2014-11-04 VITALS — BP 129/80 | HR 72 | Resp 20 | Ht 60.0 in | Wt 250.0 lb

## 2014-11-04 DIAGNOSIS — T827XXA Infection and inflammatory reaction due to other cardiac and vascular devices, implants and grafts, initial encounter: Secondary | ICD-10-CM

## 2014-11-04 DIAGNOSIS — M792 Neuralgia and neuritis, unspecified: Secondary | ICD-10-CM

## 2014-11-04 MED ORDER — POTASSIUM CHLORIDE ER 20 MEQ PO TBCR: 20.0000 meq | EXTENDED_RELEASE_TABLET | Freq: Every day | ORAL | Status: DC

## 2014-11-04 NOTE — Addendum Note (Signed)
Addended by: Ardelle Balls on: 11/04/2014 03:39 PM   Modules accepted: Level of Service

## 2014-11-04 NOTE — Progress Notes (Addendum)
Removal of large right atrial pacing wire vegetation and extraction of infected transvenous pacing leads with cardiopulmonary bypass and placement of left femoral arterial line with some slight ultrasound visualization and closure of PFO.  HPI:  Patient returns with complaints of right hand numbness and tingling. He denies any difficulty speaking, blurry or double vision, or motor difficulties. He also has noticed his legs seem more swollen and he has occasional shortness of breath with exertion.   Current Outpatient Prescriptions  Medication Sig Dispense Refill  . aspirin EC 325 MG EC tablet Take 1 tablet (325 mg total) by mouth daily.    Marland Kitchen ceFAZolin (ANCEF) 2-3 GM-% SOLR Inject 50 mLs (2 g total) into the vein every 8 (eight) hours. 111 each 0  . furosemide (LASIX) 20 MG tablet Take 40 mg by mouth every morning.    Marland Kitchen losartan (COZAAR) 50 MG tablet Take 1 tablet by mouth daily.    . metoprolol tartrate (LOPRESSOR) 25 MG tablet Take 1 tablet (25 mg total) by mouth 2 (two) times daily. 60 tablet 1  . ranitidine (ZANTAC) 150 MG tablet Take 150 mg by mouth every morning.   0  . traMADol (ULTRAM) 50 MG tablet Take 1-2 tablets (50-100 mg total) by mouth every 6 (six) hours as needed (may take one or two tablets every six hrs prn). 40 tablet 0  . warfarin (COUMADIN) 5 MG tablet Take 5 mg by mouth daily at 6 PM.    . potassium chloride 20 MEQ TBCR Take 20 mEq by mouth daily. For 5 days then stop. 5 tablet 0   No current facility-administered medications for this visit.  Vital Signs: BP 129/80, HR 72, RR 20, Oxygen saturation 93% on room air  Physical Exam: CV-RRR Pulmonary-Slightly diminished at bases bilaterally Abdomen-Soft, obese, non tender, bowel sound present Extremities-2+ LE edema Wounds-Clean and dry. No sign of infection. Small eschar left chest tube site.  Impression and Plan: Mr. Mandich is continuing to recover from removal of large atrial pacing wire vegetation and extraction  of infected transvenous pacing leads on CPB and closure of PFO. He is receiving Cefazolin 2 grams via left PICC line every 8 hours. He will need a total of 6 weeks of antibiotic. We will need to determine when last time is to be given so that PICC line may be removed by [facility]. Our office will arrange a follow up appointment with infectious disease (Dr. Ninetta Lights).Patient has an appointment to see Dr. [name] next week. He is on Coumadin (for PE) and had a PT/INR checked last Wednesday. His wife is uncertain of what the level was. Patient has another PT/INR appointment for this Wednesday 6/15. Dr. Reinaldo Raddle is following PT/INR. Regarding his neurologic symptoms, he has no motor deficits. He feels as though his right hand at times is asleep or tingling in all fingers. He has no focal deficits or other symptoms of stroke.  His radial, ulnar, and brachial pulses are intact. Perhaps this is related to positioning at the time of surgery, as it is the opposite side the leads were removed. Hopefully in time, his symptoms will improve. If not, could consider EMG study. I have instructed the patient to decrease his enteric coated aspirin from 325 mg to 81 mg as he is on Coumadin. Also, with the LE edema, occasional shortness of breath with exertion and weight gain, I have instructed him to take Lasix 40 mg bid for 5 days then continue with daily Lasix 40 mg. I have given  him a supplement of potassium to take with the Lasix 40 mg bid. He is to keep his lower extremities elevated as much as possible. If he notices any increased LE swelling or worsening shortness of breath, he is to contact the office. He will return to see [name] next Thursday for routine follow up.   Ardelle Balls, PA-C [facility] [phone]

## 2014-11-05 ENCOUNTER — Other Ambulatory Visit: Payer: Self-pay | Admitting: *Deleted

## 2014-11-05 DIAGNOSIS — R600 Localized edema: Secondary | ICD-10-CM

## 2014-11-05 MED ORDER — FUROSEMIDE 40 MG PO TABS: 40.0000 mg | ORAL_TABLET | Freq: Every day | ORAL | Status: DC

## 2014-11-11 ENCOUNTER — Other Ambulatory Visit: Payer: Self-pay | Admitting: Cardiothoracic Surgery

## 2014-11-11 DIAGNOSIS — I38 Endocarditis, valve unspecified: Secondary | ICD-10-CM

## 2014-11-12 LAB — FUNGUS CULTURE W SMEAR: Fungal Smear: NONE SEEN

## 2014-11-14 ENCOUNTER — Ambulatory Visit (INDEPENDENT_AMBULATORY_CARE_PROVIDER_SITE_OTHER): Payer: Medicare Other | Admitting: Internal Medicine

## 2014-11-14 ENCOUNTER — Other Ambulatory Visit: Payer: Self-pay

## 2014-11-14 ENCOUNTER — Ambulatory Visit (INDEPENDENT_AMBULATORY_CARE_PROVIDER_SITE_OTHER): Payer: Self-pay | Admitting: Cardiothoracic Surgery

## 2014-11-14 ENCOUNTER — Encounter: Payer: Self-pay | Admitting: Internal Medicine

## 2014-11-14 ENCOUNTER — Ambulatory Visit
Admission: RE | Admit: 2014-11-14 | Discharge: 2014-11-14 | Disposition: A | Discharge status: Home / Self Care | Payer: Medicare Other | Source: Ambulatory Visit | Attending: Cardiothoracic Surgery | Admitting: Cardiothoracic Surgery

## 2014-11-14 ENCOUNTER — Encounter: Payer: Self-pay | Admitting: Cardiothoracic Surgery

## 2014-11-14 VITALS — BP 122/74 | HR 90 | Ht 64.0 in | Wt 247.6 lb

## 2014-11-14 VITALS — BP 125/82 | HR 85 | Resp 20 | Ht 64.0 in | Wt 246.0 lb

## 2014-11-14 DIAGNOSIS — I5032 Chronic diastolic (congestive) heart failure: Secondary | ICD-10-CM

## 2014-11-14 DIAGNOSIS — J449 Chronic obstructive pulmonary disease, unspecified: Secondary | ICD-10-CM

## 2014-11-14 DIAGNOSIS — T827XXS Infection and inflammatory reaction due to other cardiac and vascular devices, implants and grafts, sequela: Secondary | ICD-10-CM

## 2014-11-14 DIAGNOSIS — R55 Syncope and collapse: Secondary | ICD-10-CM

## 2014-11-14 DIAGNOSIS — I2510 Atherosclerotic heart disease of native coronary artery without angina pectoris: Secondary | ICD-10-CM | POA: Diagnosis not present

## 2014-11-14 DIAGNOSIS — I38 Endocarditis, valve unspecified: Secondary | ICD-10-CM

## 2014-11-14 DIAGNOSIS — E663 Overweight: Secondary | ICD-10-CM

## 2014-11-14 DIAGNOSIS — Z09 Encounter for follow-up examination after completed treatment for conditions other than malignant neoplasm: Secondary | ICD-10-CM

## 2014-11-14 NOTE — Assessment & Plan Note (Signed)
He has had no recurrent symptoms. Currently no indication for repeat PPM unless he develops recurrent symptomatic bradycardia.

## 2014-11-14 NOTE — Patient Instructions (Signed)
Medication Instructions:  Your physician recommends that you continue on your current medications as directed. Please refer to the Current Medication list given to you today.   Labwork: NONE  Testing/Procedures: NONE  Follow-Up: Your physician recommends that you schedule a follow-up appointment in: 3 months with Dr. [name].   Any Other Special Instructions Will Be Listed Below (If Applicable).

## 2014-11-14 NOTE — Progress Notes (Signed)
HPI Mr. Schelle returns today for followup. 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 several weeks ago. He has been on chronic anti-biotics and is doing well. No additional fevers or chills. He has minimal incisional pain. He has lost weight. No drainage.  Allergies  Allergen Reactions  . Lovastatin Other (See Comments)    myalgias  . Lisinopril Itching     Current Outpatient Prescriptions  Medication Sig Dispense Refill  . aspirin 81 MG tablet Take 81 mg by mouth daily.    Marland Kitchen ceFAZolin (ANCEF) 2-3 GM-% SOLR Inject 50 mLs (2 g total) into the vein every 8 (eight) hours. 111 each 0  . furosemide (LASIX) 40 MG tablet Take 1 tablet (40 mg total) by mouth daily. Take one tablet twice a day for 5 days, then daily 40 tablet 0  . losartan (COZAAR) 50 MG tablet Take 1 tablet by mouth daily.    . metoprolol tartrate (LOPRESSOR) 25 MG tablet Take 1 tablet (25 mg total) by mouth 2 (two) times daily. 60 tablet 1  . ranitidine (ZANTAC) 150 MG tablet Take 150 mg by mouth every morning.   0  . traMADol (ULTRAM) 50 MG tablet Take 1-2 tablets (50-100 mg total) by mouth every 6 (six) hours as needed (may take one or two tablets every six hrs prn). (Patient taking differently: Take 50-100 mg by mouth every 6 (six) hours as needed (may take one or two tablets every six hrs prn for pain). ) 40 tablet 0  . warfarin (COUMADIN) 5 MG tablet Take 5 mg by mouth daily at 6 PM.     No current facility-administered medications for this visit.     Past Medical History  Diagnosis Date  . 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 Overweight(278.02)   . Hyperlipemia   . CAD (coronary artery disease)     a. details unclear  . HTN (hypertension)   . Osteoarthritis   . GERD (gastroesophageal reflux disease)   . BPH (benign prostatic hyperplasia)   . OSA (obstructive sleep apnea)     a. on CPAP  . Type 2 diabetes mellitus    . COPD (chronic obstructive pulmonary disease)   . Renal impairment   . Chronic anxiety   . Obesity     ROS:   All systems reviewed and negative except as noted in the HPI.   Past Surgical History  Procedure Laterality Date  . Left eye removal  1957  . Pacemaker insertion  2003    [employer]tronic  . Permanent pacemaker generator change N/A 06/23/2011    Procedure: PERMANENT PACEMAKER GENERATOR CHANGE;  Surgeon: Duke Salvia, MD;  Location: [facility] CATH LAB;  Service: Cardiovascular;  Laterality: N/A;  . Tee without cardioversion N/A 10/03/2014    Procedure: TRANSESOPHAGEAL ECHOCARDIOGRAM (TEE);  Surgeon: Wendall Stade, MD;  Location[facility]COPY;  Service: Cardiovascular;  Laterality: N/A;  . Pacemaker lead removal N/A 10/16/2014    Procedure: PACEMAKER LEAD REMOVAL;  Surgeon: Delight Ovens, MD;  Location: [facility] OR;  Service: Thoracic;  Laterality: N/A;  Extraction defected paicing lead.  Removal endocardio  vegetation  . Cannulation for cardiopulmonary bypass N/A 10/16/2014    Procedure: CANNULATION FOR CARDIOPULMONARY BYPASS;  Surgeon: Delight Ovens, MD;  Location: [facility] OR;  Service: Open Heart Surgery;  Laterality: N/A;  Left femoral Line insertion with ultrasound  .  Repair of patent foramen ovale N/A 10/16/2014    Procedure: CLOSURE OF PATENT FORAMEN OVALE;  Surgeon: Delight Ovens, MD;  Location: [facility] OR;  Service: Open Heart Surgery;  Laterality: N/A;  . Pacemaker lead removal Right 10/16/2014    Procedure: PACEMAKER LEAD EXTRACTION;  Surgeon: Marinus Maw, MD;  Location: [facility] OR;  Service: Cardiovascular;  Laterality: Right;     Family History  Problem Relation Age of Onset  . Heart disease Mother      History   Social History  . Marital Status: Married    Spouse Name: N/A  . Number of Children: N/A  . Years of Education: N/A   Occupational History  . retired    Social History Main Topics  . Smoking status: Former Smoker    Types: Cigarettes    Quit date:  05/25/1991  . Smokeless tobacco: Not on file  . Alcohol Use: No  . Drug Use: No  . Sexual Activity: Not on file   Other Topics Concern  . Not on file   Social History Narrative     BP 122/74 mmHg  Pulse 90  Ht  (1.626 m)  Wt 247 lb 9.6 oz (112.311 kg)  BMI 42.48 kg/m2  Physical Exam:  Well appearing obese 74 yo man, NAD HEENT: Unremarkable Neck:  No JVD, no thyromegally Lymphatics:  No adenopathy Back:  No CVA tenderness Lungs:  Clear with no wheezes, incision is healing nicely. HEART:  Regular rate rhythm, no murmurs, no rubs, no clicks Abd:  soft, positive bowel sounds, no organomegally, no rebound, no guarding Ext:  2 plus pulses, no edema, no cyanosis, no clubbing Skin:  No rashes no nodules Neuro:  CN II through XII intact, motor grossly intact    Assess/Plan:

## 2014-11-14 NOTE — Assessment & Plan Note (Signed)
His symptoms are class 2. He will continue a low sodium diet. No change in his meds.
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
## 2014-11-14 NOTE — Assessment & Plan Note (Signed)
He is s/p open extraction and doing well. He will continue his anti-biotics with duration as per Dr. Ninetta Lights.

## 2014-11-14 NOTE — Assessment & Plan Note (Signed)
There is a question about whether he needs a bronchodilator. I have referred him back to his primary MD. He is not currently wheezing.

## 2014-11-14 NOTE — Assessment & Plan Note (Signed)
He has lost weight and is currently hoping to get below 200 lbs.

## 2014-11-27 ENCOUNTER — Ambulatory Visit (INDEPENDENT_AMBULATORY_CARE_PROVIDER_SITE_OTHER): Payer: Medicare Other | Admitting: Infectious Diseases

## 2014-11-27 ENCOUNTER — Encounter: Payer: Self-pay | Admitting: Infectious Diseases

## 2014-11-27 VITALS — BP 111/73 | HR 75 | Temp 98.1°F | Ht 64.0 in | Wt 244.0 lb

## 2014-11-27 DIAGNOSIS — T827XXD Infection and inflammatory reaction due to other cardiac and vascular devices, implants and grafts, subsequent encounter: Secondary | ICD-10-CM | POA: Diagnosis not present

## 2014-11-27 DIAGNOSIS — Z950 Presence of cardiac pacemaker: Secondary | ICD-10-CM

## 2014-11-27 DIAGNOSIS — I2510 Atherosclerotic heart disease of native coronary artery without angina pectoris: Secondary | ICD-10-CM | POA: Diagnosis not present

## 2014-11-27 NOTE — Assessment & Plan Note (Addendum)
His pacer is out. He completes his anbx today.  He appears to be doing very well.  Will pull his PIC  He will have repeat BCx drawn in 1 week by his PCP.  As long as these are (-), I am fine with him getting new pacer if he needs.  Will ask that his PCP fax me the results of his repeat BCx.  Will rtc prn.

## 2014-11-27 NOTE — Assessment & Plan Note (Signed)
Removed 10-16-14

## 2014-11-27 NOTE — Progress Notes (Signed)
   Subjective:    Patient ID: Kenneth [name], male    DOB: 1941/05/05, 74 y.o.   MRN: [id number]  HPI  74 yo M with hx of DM (A1C 7.1%), CAD, pacer placed ~ 25 years (for AV block, syncope) ago and last generator change 2013. Comes to hospital with episodes of chills since September 2015. Per his wife he had hypothermia since (96-97) but no fever. He denies any hx of dental work, colonoscopy, wounds, animal bites (he has cats).  He was adm to hospital 5-2 to 5-3 with SOB. This was felt to be due to acute diastolic heart failure. He had f/u on 5-5 with BCx showing 1/2 CNS. On 5-12 with a TEE showing a 3.2 x 1.6 cm ventricular lead vegetation and PFO.  He is adm on 5-19 for possible pacer extraction which he had on 5-25. His BCx grew MSSE in 5/7 bottles. He was d/c home on 5-30.  Wound is well healed.  Has been feeling well. Has completed his ancef on today. His PIC is still in place. States anbx have made him feel groggy.   Has been gradually losing wt.   Review of Systems  Constitutional: Negative for fever, chills, appetite change and unexpected weight change.  Gastrointestinal: Negative for diarrhea and constipation.  Genitourinary: Negative for difficulty urinating.   Dr Ladona Ridgelaylor, Dr Tyrone SageGerhardt.     Objective:   Physical Exam  Constitutional: He appears well-developed and well-nourished.  HENT:  Mouth/Throat: No oropharyngeal exudate.  Eyes: EOM are normal. Pupils are equal, round, and reactive to light.  Neck: Neck supple.  Cardiovascular: Normal rate, regular rhythm and normal heart sounds.   Pulmonary/Chest: Effort normal and breath sounds normal.  Abdominal: Soft. Bowel sounds are normal. He exhibits no distension. There is no tenderness.  Musculoskeletal: He exhibits edema.       Arms: Lymphadenopathy:    He has no cervical adenopathy.       Assessment & Plan:

## 2014-11-28 LAB — AFB CULTURE WITH SMEAR (NOT AT ARMC): Acid Fast Smear: NONE SEEN

## 2014-12-14 ENCOUNTER — Other Ambulatory Visit: Payer: Self-pay | Admitting: Surgical

## 2015-02-11 ENCOUNTER — Ambulatory Visit (INDEPENDENT_AMBULATORY_CARE_PROVIDER_SITE_OTHER): Payer: Medicare Other | Admitting: Internal Medicine

## 2015-02-11 ENCOUNTER — Encounter: Payer: Self-pay | Admitting: Internal Medicine

## 2015-02-11 VITALS — BP 124/82 | HR 61 | Ht 64.0 in | Wt 251.2 lb

## 2015-02-11 DIAGNOSIS — T827XXS Infection and inflammatory reaction due to other cardiac and vascular devices, implants and grafts, sequela: Secondary | ICD-10-CM | POA: Diagnosis not present

## 2015-02-11 DIAGNOSIS — I2510 Atherosclerotic heart disease of native coronary artery without angina pectoris: Secondary | ICD-10-CM

## 2015-02-11 DIAGNOSIS — I442 Atrioventricular block, complete: Secondary | ICD-10-CM | POA: Diagnosis not present

## 2015-02-11 NOTE — Progress Notes (Signed)
HPI [name] returns today for followup. 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 several weeks ago. He has been on chronic anti-biotics and is doing well. No additional fevers or chills. He has minimal incisional pain. He has lost weight. No drainage. He has had no symptomatic bradycardia. Allergies  Allergen Reactions  . Lovastatin Other (See Comments)    myalgias  . Lisinopril Itching     Current Outpatient Prescriptions  Medication Sig Dispense Refill  . aspirin 81 MG tablet Take 81 mg by mouth daily.    Marland Kitchen atenolol (TENORMIN) 100 MG tablet Take 100 mg by mouth daily.    . furosemide (LASIX) 40 MG tablet Take 40 mg by mouth as directed. Pt stated he is only taking it when he is at home not doing anything    . losartan (COZAAR) 50 MG tablet Take 1 tablet by mouth daily.    Marland Kitchen omeprazole (PRILOSEC) 20 MG capsule Take 20 mg by mouth daily.    . traMADol (ULTRAM) 50 MG tablet Take 50-100 mg by mouth every 6 (six) hours as needed (pain).    Marland Kitchen warfarin (COUMADIN) 5 MG tablet Take 5 mg by mouth daily at 6 PM.     No current facility-administered medications for this visit.     Past Medical History  Diagnosis Date  . 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 Overweight(278.02)   . Hyperlipemia   . CAD (coronary artery disease)     a. details unclear  . HTN (hypertension)   . Osteoarthritis   . GERD (gastroesophageal reflux disease)   . BPH (benign prostatic hyperplasia)   . OSA (obstructive sleep apnea)     a. on CPAP  . Type 2 diabetes mellitus   . COPD (chronic obstructive pulmonary disease)   . Renal impairment   . Chronic anxiety   . Obesity     ROS:   All systems reviewed and negative except as noted in the HPI.   Past Surgical History  Procedure Laterality Date  . Left eye removal  1957  . Pacemaker insertion  2003    Medtronic  . Permanent pacemaker generator  change N/A 06/23/2011    Procedure: PERMANENT PACEMAKER GENERATOR CHANGE;  Surgeon: Duke Salvia, MD;  Location: [facility] CATH LAB;  Service: Cardiovascular;  Laterality: N/A;  . Tee without cardioversion N/A 10/03/2014    Procedure: TRANSESOPHAGEAL ECHOCARDIOGRAM (TEE);  Surgeon: Wendall Stade, MD;  Location: [facility] ENDOSCOPY;  Service: Cardiovascular;  Laterality: N/A;  . Pacemaker lead removal N/A 10/16/2014    Procedure: PACEMAKER LEAD REMOVAL;  Surgeon: Delight Ovens, MD;  Location: [facility] OR;  Service: Thoracic;  Laterality: N/A;  Extraction defected paicing lead.  Removal endocardio  vegetation  . Cannulation for cardiopulmonary bypass N/A 10/16/2014    Procedure: CANNULATION FOR CARDIOPULMONARY BYPASS;  Surgeon: Delight Ovens, MD;  Location: [facility] OR;  Service: Open Heart Surgery;  Laterality: N/A;  Left femoral Line insertion with ultrasound  . Repair of patent foramen ovale N/A 10/16/2014    Procedure: CLOSURE OF PATENT FORAMEN OVALE;  Surgeon: Delight Ovens, MD;  Location: [facility] OR;  Service: Open Heart Surgery;  Laterality: N/A;  . Pacemaker lead removal Right 10/16/2014    Procedure: PACEMAKER LEAD EXTRACTION;  Surgeon: Marinus Maw, MD;  Location[facility] OR;  Service: Cardiovascular;  Laterality: Right;  Family History  Problem Relation Age of Onset  . Heart disease Mother      Social History   Social History  . Marital Status: Married    Spouse Name: N/A  . Number of Children: N/A  . Years of Education: N/A   Occupational History  . retired    Social History Main Topics  . Smoking status: Former Smoker    Types: Cigarettes    Quit date: 05/25/1991  . Smokeless tobacco: Not on file  . Alcohol Use: No  . Drug Use: No  . Sexual Activity: Not on file   Other Topics Concern  . Not on file   Social History Narrative     BP 124/82 mmHg  Pulse 61  Ht  (1.626 m)  Wt 251 lb 3.2 oz (113.944 kg)  BMI 43.10 kg/m2  Physical Exam:  Well appearing obese 74 yo man,  NAD HEENT: Unremarkable Neck:  6 cm JVD, no thyromegally Lymphatics:  No adenopathy Back:  No CVA tenderness Lungs:  Clear with no wheezes, incision is well healed. HEART:  Regular rate rhythm, no murmurs, no rubs, no clicks Abd:  soft, positive bowel sounds, no organomegally, no rebound, no guarding Ext:  2 plus pulses, no edema, no cyanosis, no clubbing Skin:  No rashes no nodules Neuro:  CN II through XII intact, motor grossly intact    Assess/Plan:

## 2015-02-11 NOTE — Assessment & Plan Note (Signed)
He is s/p open extraction. He has healed up nicely and is off of anti-biotics with no residual infectious symptoms.

## 2015-02-11 NOTE — Assessment & Plan Note (Signed)
The patient has no symptoms of bradycardia. He will undergo watchful waiting.

## 2015-02-11 NOTE — Patient Instructions (Signed)
Medication Instructions:  Your physician recommends that you continue on your current medications as directed. Please refer to the Current Medication list given to you today.   Labwork: None ordered   Testing/Procedures: None ordered   Follow-Up: Your physician recommends that you schedule a follow-up appointment as needed    Any Other Special Instructions Will Be Listed Below (If Applicable).   

## 2015-02-12 ENCOUNTER — Ambulatory Visit: Payer: Medicare Other | Admitting: Internal Medicine

## 2015-09-29 ENCOUNTER — Telehealth: Payer: Self-pay | Admitting: Internal Medicine

## 2015-09-29 NOTE — Telephone Encounter (Signed)
New message   Pt is calling he states that he no longer has his device and   He received a letter to come into the office to have it checked  Pt wants rn to call him about device

## 2015-09-29 NOTE — Telephone Encounter (Signed)
Will forward to Device Clinic. 

## 2015-09-29 NOTE — Telephone Encounter (Signed)
Informed patient that the recall for Dr.Klein was made prior to his explant. I told him that he did not have to f/u with Dr.Klein, but that he should follow up with a cardiologist due to his history. Patient voiced understanding, but said that his PCP has been managing his care and will refer him to another MD if needed.  Will delete recall.

## 2016-03-26 ENCOUNTER — Inpatient Hospital Stay (HOSPITAL_COMMUNITY)
Admission: EM | Admit: 2016-03-26 | Discharge: 2016-03-28 | DRG: 281 | Disposition: A | Discharge status: Home / Self Care | Payer: Medicare Other | Source: Other Acute Inpatient Hospital | Attending: Cardiovascular Disease | Admitting: Cardiovascular Disease

## 2016-03-26 DIAGNOSIS — G4733 Obstructive sleep apnea (adult) (pediatric): Secondary | ICD-10-CM | POA: Diagnosis present

## 2016-03-26 DIAGNOSIS — Z87891 Personal history of nicotine dependence: Secondary | ICD-10-CM | POA: Diagnosis not present

## 2016-03-26 DIAGNOSIS — Z8249 Family history of ischemic heart disease and other diseases of the circulatory system: Secondary | ICD-10-CM

## 2016-03-26 DIAGNOSIS — R55 Syncope and collapse: Secondary | ICD-10-CM | POA: Diagnosis present

## 2016-03-26 DIAGNOSIS — F419 Anxiety disorder, unspecified: Secondary | ICD-10-CM | POA: Diagnosis present

## 2016-03-26 DIAGNOSIS — K219 Gastro-esophageal reflux disease without esophagitis: Secondary | ICD-10-CM | POA: Diagnosis present

## 2016-03-26 DIAGNOSIS — Z888 Allergy status to other drugs, medicaments and biological substances status: Secondary | ICD-10-CM

## 2016-03-26 DIAGNOSIS — I130 Hypertensive heart and chronic kidney disease with heart failure and stage 1 through stage 4 chronic kidney disease, or unspecified chronic kidney disease: Principal | ICD-10-CM | POA: Diagnosis present

## 2016-03-26 DIAGNOSIS — J449 Chronic obstructive pulmonary disease, unspecified: Secondary | ICD-10-CM | POA: Diagnosis present

## 2016-03-26 DIAGNOSIS — R0789 Other chest pain: Secondary | ICD-10-CM | POA: Diagnosis not present

## 2016-03-26 DIAGNOSIS — Z8679 Personal history of other diseases of the circulatory system: Secondary | ICD-10-CM

## 2016-03-26 DIAGNOSIS — I5032 Chronic diastolic (congestive) heart failure: Secondary | ICD-10-CM | POA: Diagnosis present

## 2016-03-26 DIAGNOSIS — Z6841 Body Mass Index (BMI) 40.0 and over, adult: Secondary | ICD-10-CM | POA: Diagnosis not present

## 2016-03-26 DIAGNOSIS — I2510 Atherosclerotic heart disease of native coronary artery without angina pectoris: Secondary | ICD-10-CM | POA: Diagnosis present

## 2016-03-26 DIAGNOSIS — E663 Overweight: Secondary | ICD-10-CM | POA: Diagnosis present

## 2016-03-26 DIAGNOSIS — M1990 Unspecified osteoarthritis, unspecified site: Secondary | ICD-10-CM | POA: Diagnosis present

## 2016-03-26 DIAGNOSIS — R079 Chest pain, unspecified: Secondary | ICD-10-CM | POA: Diagnosis not present

## 2016-03-26 DIAGNOSIS — N183 Chronic kidney disease, stage 3 unspecified: Secondary | ICD-10-CM | POA: Diagnosis present

## 2016-03-26 DIAGNOSIS — E785 Hyperlipidemia, unspecified: Secondary | ICD-10-CM | POA: Diagnosis present

## 2016-03-26 DIAGNOSIS — T827XXA Infection and inflammatory reaction due to other cardiac and vascular devices, implants and grafts, initial encounter: Secondary | ICD-10-CM | POA: Diagnosis present

## 2016-03-26 DIAGNOSIS — Z66 Do not resuscitate: Secondary | ICD-10-CM | POA: Diagnosis present

## 2016-03-26 DIAGNOSIS — E1122 Type 2 diabetes mellitus with diabetic chronic kidney disease: Secondary | ICD-10-CM | POA: Diagnosis present

## 2016-03-26 DIAGNOSIS — F329 Major depressive disorder, single episode, unspecified: Secondary | ICD-10-CM | POA: Diagnosis present

## 2016-03-26 DIAGNOSIS — I10 Essential (primary) hypertension: Secondary | ICD-10-CM | POA: Diagnosis present

## 2016-03-26 DIAGNOSIS — Z9989 Dependence on other enabling machines and devices: Secondary | ICD-10-CM

## 2016-03-26 DIAGNOSIS — Z8774 Personal history of (corrected) congenital malformations of heart and circulatory system: Secondary | ICD-10-CM | POA: Diagnosis not present

## 2016-03-26 DIAGNOSIS — I214 Non-ST elevation (NSTEMI) myocardial infarction: Secondary | ICD-10-CM | POA: Diagnosis present

## 2016-03-26 DIAGNOSIS — Z79899 Other long term (current) drug therapy: Secondary | ICD-10-CM

## 2016-03-26 DIAGNOSIS — Z7982 Long term (current) use of aspirin: Secondary | ICD-10-CM

## 2016-03-26 DIAGNOSIS — N400 Enlarged prostate without lower urinary tract symptoms: Secondary | ICD-10-CM | POA: Diagnosis present

## 2016-03-26 LAB — CBC WITH DIFFERENTIAL/PLATELET
Basophils Absolute: 0 10*3/uL (ref 0.0–0.1)
Basophils Relative: 0 %
Eosinophils Absolute: 0.1 10*3/uL (ref 0.0–0.7)
Eosinophils Relative: 1 %
HCT: 47.7 % (ref 39.0–52.0)
Hemoglobin: 16.6 g/dL (ref 13.0–17.0)
Lymphocytes Relative: 21 %
Lymphs Abs: 2 10*3/uL (ref 0.7–4.0)
MCH: 31 pg (ref 26.0–34.0)
MCHC: 34.8 g/dL (ref 30.0–36.0)
MCV: 89 fL (ref 78.0–100.0)
Monocytes Absolute: 1 10*3/uL (ref 0.1–1.0)
Monocytes Relative: 11 %
Neutro Abs: 6.4 10*3/uL (ref 1.7–7.7)
Neutrophils Relative %: 67 %
Platelets: 138 10*3/uL — ABNORMAL LOW (ref 150–400)
RBC: 5.36 MIL/uL (ref 4.22–5.81)
RDW: 14 % (ref 11.5–15.5)
WBC: 9.6 10*3/uL (ref 4.0–10.5)

## 2016-03-26 MED ORDER — ESCITALOPRAM OXALATE 10 MG PO TABS
5.0000 mg | ORAL_TABLET | Freq: Every day | ORAL | Status: DC
  Administered 2016-03-27 – 2016-03-28 (×2): 5 mg via ORAL
  Filled 2016-03-26 (×2): qty 1

## 2016-03-26 MED ORDER — FAMOTIDINE 20 MG PO TABS
20.0000 mg | ORAL_TABLET | Freq: Two times a day (BID) | ORAL | Status: DC
  Administered 2016-03-27 – 2016-03-28 (×4): 20 mg via ORAL
  Filled 2016-03-26 (×4): qty 1

## 2016-03-26 MED ORDER — LOSARTAN POTASSIUM 50 MG PO TABS
50.0000 mg | ORAL_TABLET | Freq: Every day | ORAL | Status: DC
  Administered 2016-03-27 – 2016-03-28 (×2): 50 mg via ORAL
  Filled 2016-03-26 (×2): qty 1

## 2016-03-26 MED ORDER — BUPROPION HCL 75 MG PO TABS
37.5000 mg | ORAL_TABLET | Freq: Two times a day (BID) | ORAL | Status: DC
  Administered 2016-03-27 – 2016-03-28 (×3): 37.5 mg via ORAL
  Filled 2016-03-26 (×4): qty 0.5

## 2016-03-26 MED ORDER — ACETAMINOPHEN 325 MG PO TABS: 650.0000 mg | ORAL_TABLET | ORAL | Status: DC | PRN

## 2016-03-26 MED ORDER — ASPIRIN 81 MG PO CHEW
81.0000 mg | CHEWABLE_TABLET | Freq: Every day | ORAL | Status: DC
  Administered 2016-03-27 – 2016-03-28 (×2): 81 mg via ORAL
  Filled 2016-03-26 (×2): qty 1

## 2016-03-26 MED ORDER — ATENOLOL 100 MG PO TABS
100.0000 mg | ORAL_TABLET | Freq: Every day | ORAL | Status: DC
  Administered 2016-03-27: 100 mg via ORAL
  Filled 2016-03-26: qty 1

## 2016-03-26 MED ORDER — NITROGLYCERIN 0.4 MG SL SUBL: 0.4000 mg | SUBLINGUAL_TABLET | SUBLINGUAL | Status: DC | PRN

## 2016-03-26 MED ORDER — HEPARIN (PORCINE) IN NACL 100-0.45 UNIT/ML-% IJ SOLN
1000.0000 [IU]/h | INTRAMUSCULAR | Status: DC
  Administered 2016-03-27 – 2016-03-28 (×2): 1000 [IU]/h via INTRAVENOUS
  Filled 2016-03-26 (×2): qty 250

## 2016-03-26 NOTE — H&P (Signed)
History and Physical  Primary Cardiologist:Taylor PCP: Jerene PitchPRYOR,ROBERT E, MD  Chief Complaint: Syncope, chest pain  HPI:  75 YO male with history of prev AV nodal disease s/p pacemaker s/p generator change and subsequent explant 2016 for infection (removal of the lead and closure of PFO on cardiopulmonary bypass-- s/p sternotomy).  No known CAD other than reports of previous non-obstructive CAD.  Acutely at home this morning he had 30 minutes of chest pain at rest without associated other symptoms (N/V, SOB, sweating)  Later in the day, he acutely lost consciousness without prodrome (no CP, lightheadedness) and called EMS who took him to local ED.  W/U notable for elevated troponin I to 0.112, NSR rate 61 with non-specific ST-T changes.  Crt 1.7 (history of CKD).  He was given 325 ASA, heparin and sent here for further evaluation.  No further episodes of chest pain.  Given findings, OSH ED requesting cardiology transfer. On interview, totally asymptomatic.  No SOB, edema.  No bleeding/bruising issues.    He notably started new anti-depressant 2 weeks ago (escitalopram) to help with depression after the passing of his wife of >50 years.  Already on bupropion.    Prior Studies  Pacemaker removal for infection 10/16/2014 (removal of the lead and closure of PFO on cardiopulmonary bypass-- s/p sternotomy)  TTE 09/23/2014 - Left ventricle: The cavity size was normal. Systolic function was   normal. The estimated ejection fraction was in the range of 50%   to 55%. Regional wall motion abnormalities cannot be excluded.   Doppler parameters are consistent with abnormal left ventricular   relaxation (grade 1 diastolic dysfunction). - Left atrium: The atrium was mildly dilated.  SPECT 10/11/2014  There was no ST segment deviation noted during stress.  Defect 1: There is a medium defect of moderate severity present in the basal inferior, mid inferior and apical inferior location.  This is a low risk  study.  The left ventricular ejection fraction is mildly decreased (45-54%).   Past Medical History:  Diagnosis Date  . AV block    a. s/p MDT dual chamber pacemaker  . BPH (benign prostatic hyperplasia)   . CAD (coronary artery disease)    a. details unclear  . Chronic anxiety   . COPD (chronic obstructive pulmonary disease)   . GERD (gastroesophageal reflux disease)   . HTN (hypertension)   . Hyperlipemia   . Obesity   . OSA (obstructive sleep apnea)    a. on CPAP  . Osteoarthritis   . Overweight(278.02)   . Renal impairment   . Type 2 diabetes mellitus     Past Surgical History:  Procedure Laterality Date  . CANNULATION FOR CARDIOPULMONARY BYPASS N/A 10/16/2014   Procedure: CANNULATION FOR CARDIOPULMONARY BYPASS;  Surgeon: Delight OvensEdward B Gerhardt, MD;  [facility];  Service: Open Heart Surgery;  Laterality: N/A;  Left femoral Line insertion with ultrasound  . left eye removal  1957  . PACEMAKER INSERTION  2003   [employer]  . PACEMAKER LEAD REMOVAL N/A 10/16/2014   Procedure: PACEMAKER LEAD REMOVAL;  Surgeon: Delight OvensEdward B Gerhardt, MD;  [facility];  Service: Thoracic;  Laterality: N/A;  Extraction defected paicing lead.  Removal endocardio  vegetation  . PACEMAKER LEAD REMOVAL Right 10/16/2014   Procedure: PACEMAKER LEAD EXTRACTION;  Surgeon: Marinus MawGregg W Taylor, MD;  [facility];  Service: Cardiovascular;  Laterality: Right;  . PERMANENT PACEMAKER GENERATOR CHANGE N/A 06/23/2011   Procedure: PERMANENT PACEMAKER GENERATOR CHANGE;  Surgeon: Duke SalviaSteven C Klein,  MD;  Location: [facility] CATH LAB;  Service: Cardiovascular;  Laterality: N/A;  . REPAIR OF PATENT FORAMEN OVALE N/A 10/16/2014   Procedure: CLOSURE OF PATENT FORAMEN OVALE;  Surgeon: Delight OvensEdward B Gerhardt, MD;  Location: [facility] OR;  Service: Open Heart Surgery;  Laterality: N/A;  . TEE WITHOUT CARDIOVERSION N/A 10/03/2014   Procedure: TRANSESOPHAGEAL ECHOCARDIOGRAM (TEE);  Surgeon: Wendall StadePeter C Nishan, MD;  [facility];  Service:  Cardiovascular;  Laterality: N/A;    Family History  Problem Relation Age of Onset  . Heart disease Mother    Social History:  reports that he quit smoking about 24 years ago. His smoking use included Cigarettes. He does not have any smokeless tobacco history on file. He reports that he does not drink alcohol or use drugs.  Allergies:  Allergies  Allergen Reactions  . Lovastatin Other (See Comments)    myalgias  . Lisinopril Itching    No current facility-administered medications on file prior to encounter.    Current Outpatient Prescriptions on File Prior to Encounter  Medication Sig Dispense Refill  . aspirin 81 MG tablet Take 81 mg by mouth daily.    Marland Kitchen. atenolol (TENORMIN) 100 MG tablet Take 100 mg by mouth daily.    Marland Kitchen. losartan (COZAAR) 50 MG tablet Take 1 tablet by mouth daily.      No results found for this or any previous visit (from the past 48 hour(s)). No results found.  ECG/Tele: NSR rate 61 non-specific st t w changes  ROS: As above. Otherwise, review of systems is negative unless per above HPI  Vitals:   03/26/16 2222  BP: (!) 138/55  Pulse: 74  Resp: 20  Temp: 97.8 F (36.6 C)  TempSrc: Oral  SpO2: 96%  Weight: 100 kg (220 lb 8 oz)  Height: 5\' 4"  (1.626 m)   Wt Readings from Last 10 Encounters:  03/26/16 100 kg (220 lb 8 oz)  02/11/15 113.9 kg (251 lb 3.2 oz)  11/27/14 110.7 kg (244 lb)  11/14/14 111.6 kg (246 lb)  11/14/14 112.3 kg (247 lb 9.6 oz)  11/04/14 113.4 kg (250 lb)  10/22/14 114.5 kg (252 lb 6.4 oz)  10/10/14 114.3 kg (252 lb)  10/09/14 114.3 kg (252 lb)  09/26/14 117.5 kg (259 lb)    PE:  General: No acute distress, obese HEENT: Atraumatic, EOMI, mucous membranes moist CV: Distant, RRR no murmurs, gallops. JVD ~7 cm at  45 degrees. No HJR. Respiratory: Clear, no crackles. Normal work of breathing ABD: Non-distended and non-tender. No palpable organomegaly.  Extremities: 2+ radial pulses bilaterally. Minimal LE  edema. Neuro/Psych: CN grossly intact, alert and oriented  Assessment/Plan NSTEMI Syncope Depression GERD HTN Prev statin intolerant Previous diastolic CHF  NSTEMI- will continue to trend to determine peak.  CP earlier in the day with risk factors concerning for small type I event with associated syncope without other obvious etiology - Trend trop, ecg - heparin, home atenolol, home losartan - prev statin intolerance, discuss further - TTE - Likely ischemic eval; possibly favor non-invasive if trop remains low level given CKD  Syncope- as above, eval with echo and some form of ischemic eval   Depression- continue home med.  Repeat ECG to eval QT interval  GERD - equivalent H2 blocker  HTN- as above  Prev statin intolerant - as above  GOC - clearly would like to be DNR/DNI, discussed with patient in detail; likely needs MOST/DNR form at D/C  Greig CastillaGregory Thomas Means  MD 03/26/2016, 11:27 PM

## 2016-03-26 NOTE — Progress Notes (Signed)
ANTICOAGULATION CONSULT NOTE - Initial Consult  Pharmacy Consult for heparin Indication: chest pain/ACS  Allergies  Allergen Reactions  . Lovastatin Other (See Comments)    myalgias  . Lisinopril Itching    Patient Measurements: Height: 5\' 4"  (162.6 cm) Weight: 220 lb 8 oz (100 kg) IBW/kg (Calculated) : 59.2 Heparin Dosing Weight: 80kg  Vital Signs: Temp: 97.8 F (36.6 C) (11/03 2222) Temp Source: Oral (11/03 2222) BP: 138/55 (11/03 2222) Pulse Rate: 74 (11/03 2222)   Medical History: Past Medical History:  Diagnosis Date  . AV block    a. s/p MDT dual chamber pacemaker  . BPH (benign prostatic hyperplasia)   . CAD (coronary artery disease)    a. details unclear  . Chronic anxiety   . COPD (chronic obstructive pulmonary disease)   . GERD (gastroesophageal reflux disease)   . HTN (hypertension)   . Hyperlipemia   . Obesity   . OSA (obstructive sleep apnea)    a. on CPAP  . Osteoarthritis   . Overweight(278.02)   . Renal impairment   . Type 2 diabetes mellitus     Assessment: 75yo male tx'd from OSH for further cardiac w/u, known h/o MI, to continue heparin started at OSH.  Labs from OSH: WBC 8.6, Hgb 16.4, Plt 150, K 4.6, SCr 1.7, AST/ALT 57/65, troponin 0.112  Home meds ASA, atenolol, Wellbutrin, Lexapro, losartan, ranitidine.  Goal of Therapy:  Heparin level 0.3-0.7 units/ml Monitor platelets by anticoagulation protocol: Yes   Plan:  OSH gave heparin 5000 units IV bolus followed by gtt at 1000 units/hr started ~1900; will continue at current rate for now and monitor heparin levels and CBC.  Vernard GamblesVeronda Jonell Brumbaugh, PharmD, BCPS  03/26/2016,11:32 PM

## 2016-03-26 NOTE — Progress Notes (Signed)
Patient transferred to unit via Ambulance. Patient oriented to room. Patient informed of belongings policy and educated on calling for help when needed.  Patient is resting in bed. Call light within reach. Petra Kubarica Myrla Malanowski RN 03/26/2016 10:58 PM

## 2016-03-27 ENCOUNTER — Encounter (HOSPITAL_COMMUNITY): Payer: Self-pay

## 2016-03-27 DIAGNOSIS — I214 Non-ST elevation (NSTEMI) myocardial infarction: Secondary | ICD-10-CM

## 2016-03-27 LAB — COMPREHENSIVE METABOLIC PANEL
ALK PHOS: 55 U/L (ref 38–126)
ALT: 44 U/L (ref 17–63)
ANION GAP: 9 (ref 5–15)
AST: 40 U/L (ref 15–41)
Albumin: 3.3 g/dL — ABNORMAL LOW (ref 3.5–5.0)
BUN: 24 mg/dL — ABNORMAL HIGH (ref 6–20)
CALCIUM: 9.3 mg/dL (ref 8.9–10.3)
CHLORIDE: 106 mmol/L (ref 101–111)
CO2: 25 mmol/L (ref 22–32)
CREATININE: 1.53 mg/dL — AB (ref 0.61–1.24)
GFR, EST AFRICAN AMERICAN: 50 mL/min — AB (ref >60)
GFR, EST NON AFRICAN AMERICAN: 43 mL/min — AB (ref >60)
Glucose, Bld: 117 mg/dL — ABNORMAL HIGH (ref 65–99)
Potassium: 4.1 mmol/L (ref 3.5–5.1)
SODIUM: 140 mmol/L (ref 135–145)
Total Bilirubin: 1.3 mg/dL — ABNORMAL HIGH (ref 0.3–1.2)
Total Protein: 6.5 g/dL (ref 6.5–8.1)

## 2016-03-27 LAB — CBC
HCT: 46 % (ref 39.0–52.0)
Hemoglobin: 15.8 g/dL (ref 13.0–17.0)
MCH: 30.4 pg (ref 26.0–34.0)
MCHC: 34.3 g/dL (ref 30.0–36.0)
MCV: 88.6 fL (ref 78.0–100.0)
PLATELETS: 139 10*3/uL — AB (ref 150–400)
RBC: 5.19 MIL/uL (ref 4.22–5.81)
RDW: 13.9 % (ref 11.5–15.5)
WBC: 8.4 10*3/uL (ref 4.0–10.5)

## 2016-03-27 LAB — TROPONIN I
TROPONIN I: 0.41 ng/mL — AB (ref <0.03)
Troponin I: 0.5 ng/mL (ref <0.03)
Troponin I: 0.22 ng/mL (ref <0.03)

## 2016-03-27 LAB — PROTIME-INR
INR: 1.09
PROTHROMBIN TIME: 14.2 s (ref 11.4–15.2)

## 2016-03-27 LAB — APTT: APTT: 117 s — AB (ref 24–36)

## 2016-03-27 LAB — HEPARIN LEVEL (UNFRACTIONATED)
HEPARIN UNFRACTIONATED: 0.41 [IU]/mL (ref 0.30–0.70)
Heparin Unfractionated: 0.25 IU/mL — ABNORMAL LOW (ref 0.30–0.70)

## 2016-03-27 LAB — BRAIN NATRIURETIC PEPTIDE: B NATRIURETIC PEPTIDE 5: 194.6 pg/mL — AB (ref 0.0–100.0)

## 2016-03-27 LAB — MAGNESIUM: MAGNESIUM: 2 mg/dL (ref 1.7–2.4)

## 2016-03-27 MED ORDER — ATENOLOL 50 MG PO TABS
50.0000 mg | ORAL_TABLET | Freq: Every day | ORAL | Status: DC
  Administered 2016-03-28: 50 mg via ORAL
  Filled 2016-03-27: qty 1

## 2016-03-27 NOTE — Progress Notes (Signed)
Patient Name: Kenneth [name] Date of Encounter: 03/27/2016  Hospital Problem List     Principal Problem:   NSTEMI (non-ST elevated myocardial infarction) [facility](HCC) Active Problems:   Obesity- BMI 42   SYNCOPE   Pacemaker infection (HCC)   Chronic renal insufficiency, stage III (moderate)   Chronic diastolic heart failure (HCC)   Chronic anxiety   Arteriosclerosis of coronary artery   Acid reflux   Benign essential HTN    Patient Profile     75 YO male with history of prev AV nodal disease s/p pacemaker s/p generator change and subsequent explant 2016 for infection (removal of the lead and closure of PFO on cardiopulmonary bypass-- s/p sternotomy).  No known CAD other than reports of previous non-obstructive CAD.  Admitted with chest pain and syncope.   Subjective   No further chest pain.  No SOB.  No dizziness.   Inpatient Medications    . aspirin  81 mg Oral Daily  . atenolol  100 mg Oral Daily  . buPROPion  37.5 mg Oral BID  . escitalopram  5 mg Oral Daily  . famotidine  20 mg Oral BID  . losartan  50 mg Oral Daily    Vital Signs    Vitals:   03/26/16 2222 03/27/16 0510 03/27/16 0939  BP: (!) 138/55 (!) 107/58 110/75  Pulse: 74 (!) 55 (!) 55  Resp: 20 18 20   Temp: 97.8 F (36.6 C) 97.9 F (36.6 C) 98.3 F (36.8 C)  TempSrc: Oral Oral Oral  SpO2: 96% 95% 98%  Weight: 220 lb 8 oz (100 kg) 217 lb 6.4 oz (98.6 kg)   Height: 5\' 4"  (1.626 m)      Intake/Output Summary (Last 24 hours) at 03/27/16 1143 Last data filed at 03/27/16 0924  Gross per 24 hour  Intake              487 ml  Output              250 ml  Net              237 ml   Filed Weights   03/26/16 2222 03/27/16 0510  Weight: 220 lb 8 oz (100 kg) 217 lb 6.4 oz (98.6 kg)    Physical Exam    GEN: Well nourished, well developed, in  no acute distress.  Neck: Supple, no JVD, carotid bruits, or masses. Cardiac: RRR, no rubs, or gallops. No clubbing, cyanosis, no edema.  Radials/DP/PT 2+ and equal  bilaterally.  Respiratory:  Respirations  regular and unlabored, clear to auscultation bilaterally. GI: Soft, nontender, nondistended, BS + x 4. Neuro:  Strength and sensation are intact.   Labs    CBC  Recent Labs  03/26/16 2329 03/27/16 0333  WBC 9.6 8.4  NEUTROABS 6.4  --   HGB 16.6 15.8  HCT 47.7 46.0  MCV 89.0 88.6  PLT 138* 139*   Basic Metabolic Panel  Recent Labs  03/27/16 0338  NA 140  K 4.1  CL 106  CO2 25  GLUCOSE 117*  BUN 24*  CREATININE 1.53*  CALCIUM 9.3  MG 2.0   Liver Function Tests  Recent Labs  03/27/16 0338  AST 40  ALT 44  ALKPHOS 55  BILITOT 1.3*  PROT 6.5  ALBUMIN 3.3*   No results for input(s): LIPASE, AMYLASE in the last 72 hours. Cardiac Enzymes  Recent Labs  03/26/16 2329 03/27/16 0333  TROPONINI 0.50* 0.41*   BNP Invalid input(s): POCBNP D-Dimer  No results for input(s): DDIMER in the last 72 hours. Hemoglobin A1C No results for input(s): HGBA1C in the last 72 hours. Fasting Lipid Panel No results for input(s): CHOL, HDL, LDLCALC, TRIG, CHOLHDL, LDLDIRECT in the last 72 hours. Thyroid Function Tests No results for input(s): TSH, T4TOTAL, T3FREE, THYROIDAB in the last 72 hours.  Invalid input(s): FREET3  Telemetry    SB. No pauses.    ECG    NA  Radiology    No results found.  Assessment & Plan    ELEVATED TROPONIN:  Trend is flat.  No further pain.  Plan Lexiscan Myoview in the AM.  SYNCOPE:  I suspect that this was orthostatic.  No pauses on tele.  Rate is slow and he is fatigued.  I am going to reduce his beta blocker.   CAD:  As above.  CKD:  Stable.    Signed, Rollene RotundaJames Esley Brooking, MD  03/27/2016, 11:43 AM

## 2016-03-27 NOTE — Progress Notes (Signed)
ANTICOAGULATION CONSULT NOTE - Follow Up Consult  Pharmacy Consult for heparin Indication: chest pain/ACS  Labs:  Recent Labs  03/26/16 2329 03/27/16 0333 03/27/16 0338 03/27/16 1139 03/27/16 1140  HGB 16.6 15.8  --   --   --   HCT 47.7 46.0  --   --   --   PLT 138* 139*  --   --   --   APTT 117*  --   --   --   --   LABPROT 14.2  --   --   --   --   INR 1.09  --   --   --   --   HEPARINUNFRC  --  0.25*  --   --  0.41  CREATININE  --   --  1.53*  --   --   TROPONINI 0.50* 0.41*  --  0.22*  --     Assessment: 75yo male subtherapeutic on heparin with initial dosing for possible ACS;  PM heparin level therapeutic  Goal of Therapy:  Heparin level 0.3-0.7 units/ml   Plan:  Continue heparin at 1000 units/hr Follow up AM labs  Thank you Okey RegalLisa Anuel Sitter, PharmD [phone] 03/27/2016,12:44 PM

## 2016-03-27 NOTE — Progress Notes (Signed)
CRITICAL VALUE ALERT  Critical value received:  Troponin  Date of notification:  03/27/16  Time of notification:  2329  Critical value read back: .50  Nurse who received alert:  Barron Schmidallas CN  MD notified (1st page):  Earl LitesGregory Mean  Time of first page:  0032  MD notified (2nd page):  Time of second page:  Responding MD:  Earl LitesGregory Mean  Time MD responded:  0109 Petra KubaErica Khalidah Herbold RN 03/27/2016 5:21 AM

## 2016-03-27 NOTE — Progress Notes (Addendum)
ANTICOAGULATION CONSULT NOTE - Follow Up Consult  Pharmacy Consult for heparin Indication: chest pain/ACS  Labs:  Recent Labs  03/26/16 2329 03/27/16 0333  HGB 16.6 15.8  HCT 47.7 46.0  PLT 138* 139*  APTT 117*  --   LABPROT 14.2  --   INR 1.09  --   HEPARINUNFRC  --  0.25*  TROPONINI 0.50*  --     Assessment: 75yo male subtherapeutic on heparin with initial dosing for possible ACS; RN reports that she changed rate when order entered but that she hadn't changed to our heparin bag which is a different concentration.  Goal of Therapy:  Heparin level 0.3-0.7 units/ml   Plan:  Spoke w/ RN who will change to our heparin bag and keep at current rate of 1000 units/hr and check another level.  Vernard GamblesVeronda Towanna Avery, PharmD, BCPS  03/27/2016,4:32 AM

## 2016-03-28 ENCOUNTER — Inpatient Hospital Stay (HOSPITAL_COMMUNITY): Payer: Medicare Other

## 2016-03-28 DIAGNOSIS — R079 Chest pain, unspecified: Secondary | ICD-10-CM

## 2016-03-28 DIAGNOSIS — R0789 Other chest pain: Secondary | ICD-10-CM

## 2016-03-28 LAB — CBC
HEMATOCRIT: 44.6 % (ref 39.0–52.0)
Hemoglobin: 15.4 g/dL (ref 13.0–17.0)
MCH: 30.6 pg (ref 26.0–34.0)
MCHC: 34.5 g/dL (ref 30.0–36.0)
MCV: 88.7 fL (ref 78.0–100.0)
PLATELETS: 135 10*3/uL — AB (ref 150–400)
RBC: 5.03 MIL/uL (ref 4.22–5.81)
RDW: 14 % (ref 11.5–15.5)
WBC: 7.1 10*3/uL (ref 4.0–10.5)

## 2016-03-28 LAB — ECHOCARDIOGRAM COMPLETE
HEIGHTINCHES: 64 in
Weight: 3491.2 oz

## 2016-03-28 LAB — NM MYOCAR MULTI W/SPECT W/WALL MOTION / EF
CSEPEDS: 0 s
CSEPEW: 1 METS
CSEPHR: 50 %
CSEPPHR: 74 {beats}/min
Exercise duration (min): 0 min
MPHR: 146 {beats}/min
Rest HR: 52 {beats}/min

## 2016-03-28 LAB — HEPARIN LEVEL (UNFRACTIONATED): HEPARIN UNFRACTIONATED: 0.53 [IU]/mL (ref 0.30–0.70)

## 2016-03-28 MED ORDER — ATENOLOL 100 MG PO TABS: 50.0000 mg | ORAL_TABLET | Freq: Every day | ORAL | Status: AC

## 2016-03-28 MED ORDER — REGADENOSON 0.4 MG/5ML IV SOLN
0.4000 mg | Freq: Once | INTRAVENOUS | Status: AC
  Administered 2016-03-28: 0.4 mg via INTRAVENOUS
  Filled 2016-03-28: qty 5

## 2016-03-28 MED ORDER — TECHNETIUM TC 99M TETROFOSMIN IV KIT
30.0000 | PACK | Freq: Once | INTRAVENOUS | Status: AC | PRN
  Administered 2016-03-28: 30 via INTRAVENOUS

## 2016-03-28 MED ORDER — TECHNETIUM TC 99M TETROFOSMIN IV KIT
10.0000 | PACK | Freq: Once | INTRAVENOUS | Status: AC | PRN
  Administered 2016-03-28: 10 via INTRAVENOUS

## 2016-03-28 MED ORDER — REGADENOSON 0.4 MG/5ML IV SOLN
INTRAVENOUS | Status: AC
  Filled 2016-03-28: qty 5

## 2016-03-28 MED ORDER — PERFLUTREN LIPID MICROSPHERE
1.0000 mL | INTRAVENOUS | Status: AC | PRN
  Administered 2016-03-28: 2 mL via INTRAVENOUS
  Filled 2016-03-28 (×2): qty 10

## 2016-03-28 NOTE — Progress Notes (Signed)
Echo done.

## 2016-03-28 NOTE — Progress Notes (Signed)
Patient Name: Kenneth [name] Date of Encounter: 03/28/2016  Primary Cardiologist: Dr. Renee Rivalaylor  Hospital Problem List     Principal Problem:   NSTEMI (non-ST elevated myocardial infarction) [facility])(HCC) Active Problems:   Obesity- BMI 42   SYNCOPE   Pacemaker infection (HCC)   Chronic renal insufficiency, stage III (moderate)   Chronic diastolic heart failure (HCC)   Chronic anxiety   Arteriosclerosis of coronary artery   Acid reflux   Benign essential HTN    Subjective   Denies any repeat episodes of chest discomfort. Seen in Nuclear Medicine for 1-day NST.  Inpatient Medications    Scheduled Meds: . regadenoson      . aspirin  81 mg Oral Daily  . atenolol  50 mg Oral Daily  . buPROPion  37.5 mg Oral BID  . escitalopram  5 mg Oral Daily  . famotidine  20 mg Oral BID  . losartan  50 mg Oral Daily  . regadenoson  0.4 mg Intravenous Once   Continuous Infusions: . heparin 1,000 Units/hr (03/28/16 0145)   PRN Meds: acetaminophen, nitroGLYCERIN   Vital Signs    Vitals:   03/28/16 0905 03/28/16 0907 03/28/16 0910 03/28/16 0912  BP: 140/67 (!) 179/67 121/79 125/65  Pulse: (!) 53 (!) 58 66 67  Resp:      Temp:      TempSrc:      SpO2:      Weight:      Height:        Intake/Output Summary (Last 24 hours) at 03/28/16 0913 Last data filed at 03/28/16 0853  Gross per 24 hour  Intake           852.34 ml  Output              425 ml  Net           427.34 ml   Filed Weights   03/26/16 2222 03/27/16 0510 03/28/16 0544  Weight: 220 lb 8 oz (100 kg) 217 lb 6.4 oz (98.6 kg) 218 lb 3.2 oz (99 kg)    Physical Exam    GEN: Overweight Caucasian male appearing in no acute distress.  HEENT: Grossly normal.  Neck: Supple, no JVD, carotid bruits, or masses. Cardiac: RRR, no murmurs, rubs, or gallops. No clubbing, cyanosis, edema.  Radials/DP/PT 2+ and equal bilaterally.  Respiratory:  Respirations regular and unlabored, clear to auscultation bilaterally. GI: Soft, nontender,  nondistended, BS + x 4. MS: no deformity or atrophy. Skin: warm and dry, no rash. Neuro:  Strength and sensation are intact. Psych: AAOx3.  Normal affect.  Labs    CBC  Recent Labs  03/26/16 2329 03/27/16 0333 03/28/16 0319  WBC 9.6 8.4 7.1  NEUTROABS 6.4  --   --   HGB 16.6 15.8 15.4  HCT 47.7 46.0 44.6  MCV 89.0 88.6 88.7  PLT 138* 139* 135*   Basic Metabolic Panel  Recent Labs  03/27/16 0338  NA 140  K 4.1  CL 106  CO2 25  GLUCOSE 117*  BUN 24*  CREATININE 1.53*  CALCIUM 9.3  MG 2.0   Liver Function Tests  Recent Labs  03/27/16 0338  AST 40  ALT 44  ALKPHOS 55  BILITOT 1.3*  PROT 6.5  ALBUMIN 3.3*   No results for input(s): LIPASE, AMYLASE in the last 72 hours. Cardiac Enzymes  Recent Labs  03/26/16 2329 03/27/16 0333 03/27/16 1139  TROPONINI 0.50* 0.41* 0.22*   BNP Invalid input(s): POCBNP D-Dimer No  results for input(s): DDIMER in the last 72 hours. Hemoglobin A1C No results for input(s): HGBA1C in the last 72 hours. Fasting Lipid Panel No results for input(s): CHOL, HDL, LDLCALC, TRIG, CHOLHDL, LDLDIRECT in the last 72 hours. Thyroid Function Tests No results for input(s): TSH, T4TOTAL, T3FREE, THYROIDAB in the last 72 hours.  Invalid input(s): FREET3  Telemetry    Not Reviewed, seen in Nuclear Medicine  ECG    Sinus bradycardia, HR 52, with no acute ST or T-wave changes - Personally Reviewed  Radiology    No results found.  Cardiac Studies   Echo: Pending  Patient Profile     75 YO male w/ PMH of prev AV nodal disease (s/p pacemaker and subsequent explant in 2016 for infection (removal of the lead and closure of PFO on cardiopulmonary bypass-- s/p sternotomy)), HTN, and HLD admitted with chest pain and syncope.   Assessment & Plan    1. Chest Pain/ Elevated Troponin - denies any recurrent episodes since being admitted. - cyclic troponin values have been flat at 0.50, 0.41, and 0.22. Currently on Heparin.  - seen  for 1-day NST and tolerated stress portion with any complications. Official read pending following stress images.   2. Syncope - thought to be orthostatic in etiology. - noted to be bradycardiac on telemetry. Atenolol dosing decreased from 100mg  daily to 50mg  daily.   3. Stage 3 CKD - creatinine stable at 1.53.  Signed, Kenneth LennoxBrittany M Strader, PA  03/28/2016, 9:13 AM   History and all data above reviewed.  Patient examined.  I agree with the findings as above.  No pain.  No SOB. Status post stress test (results pending)  The patient exam reveals COR:RRR  ,  Lungs: Clear  ,  Abd: Positive bowel sounds, no rebound no guarding, Ext No edema  .  All available labs, radiology testing, previous records reviewed. Agree with documented assessment and plan. No further syncope or chest pain.  Await results of stress test.  No further work up if this is negative for ischemia.   Kenneth [name]  10:08 AM  03/28/2016

## 2016-03-28 NOTE — Progress Notes (Signed)
ANTICOAGULATION CONSULT NOTE - Follow Up Consult  Pharmacy Consult for heparin Indication: chest pain/ACS  Labs:  Recent Labs  03/26/16 2329 03/27/16 0333 03/27/16 0338 03/27/16 1139 03/27/16 1140 03/28/16 0319  HGB 16.6 15.8  --   --   --  15.4  HCT 47.7 46.0  --   --   --  44.6  PLT 138* 139*  --   --   --  135*  APTT 117*  --   --   --   --   --   LABPROT 14.2  --   --   --   --   --   INR 1.09  --   --   --   --   --   HEPARINUNFRC  --  0.25*  --   --  0.41 0.53  CREATININE  --   --  1.53*  --   --   --   TROPONINI 0.50* 0.41*  --  0.22*  --   --     Assessment: 75 year old male continues on heparin for ACS Heparin level therapeutic, CBC stable  Goal of Therapy:  Heparin level 0.3-0.7 units/ml   Plan:  Continue heparin at 1000 units/hr Follow up AM labs  Thank you Okey RegalLisa Dadrian Ballantine, PharmD [phone] 03/28/2016,11:49 AM

## 2016-03-28 NOTE — Discharge Summary (Signed)
Discharge Summary    Patient ID: Kenneth [name],  MRN: [id number], DOB/AGE: February 05, 1941 75 y.o.  Admit date: 03/26/2016 Discharge date: 03/28/2016  Primary Care Provider: PRYOR,ROBERT E Primary Cardiologist: Dr. Ladona Ridgelaylor  Discharge Diagnoses    Principal Problem:   NSTEMI (non-ST elevated myocardial infarction) [facility](HCC) Active Problems:   Obesity- BMI 42   SYNCOPE   Chronic renal insufficiency, stage III (moderate)   Chronic diastolic heart failure (HCC)   Chronic anxiety   Arteriosclerosis of coronary artery   Acid reflux   Benign essential HTN   History of Present Illness     Kenneth [name] is a 75 y.o. male with past medical history of previous AV nodal disease (s/p pacemaker and subsequent explant in 2016 for infection (removal of the lead and closure of PFO on cardiopulmonary bypass-- s/p sternotomy)), HTN, HLD, chronic diastolic CHF, and Stage 3 CKD who [facility] for evaluation of chest pain.   Reported developing a chest pain at rest earlier in the day which lasted approximately 30 minutes. This occurred at rest and resolved spontaneously with no associated symptoms. Later in the day, he experienced a syncopal episode. Unsure of how long he lost consciousness. Reported recently starting Escitalopram for depression due to the recent passing of his wife.   Initial labs showed a WBC of 9.6, Hgb 16.6, platelets 138. Creatinine 1.53. BNP 194. Initial troponin 0.50. EKG showed sinus bradycardia, HR 52, with nonspecific T-wave changes. He was started on Heparin and admitted for further evaluation.    Hospital Course     Consultants: None  The following morning, he denied any repeat episodes of chest pain. Troponin values trended down to 0.41 and 0.22. With his CKD, a Lexiscan Myoview was recommended for initial evaluation.  This was performed on 03/28/2016 and showed no evidence of reversible ischemia or infarction. EF was estimated at 44% and the  study was overall low-risk. An echocardiogram was obtained to further evaluate his EF which was read as 50-55% with no regional WMA identified. PA pressure was moderately increased at 42 mm Hg.   With his low-risk NST and flat troponin trend, no further ischemic testing was pursued this admission. In regards to his syncopal event, this was thought to be of orthostatic etiology. No significant pauses or arrhythmias were noted on telemetry. He was bradycardiac at times with his HR in the mid-50's, therefore Atenolol dosing was decreased from 100mg  daily to 50mg  daily.   He was last examined by Dr. Antoine PocheHochrein and deemed stable for discharge. A staff message has been sent to the office to arrange for hospital follow-up.  _____________  Discharge Vitals Blood pressure (!) 116/56, pulse (!) 59, temperature 98.5 F (36.9 C), temperature source Oral, resp. rate 20, height 5\' 4"  (1.626 m), weight 218 lb 3.2 oz (99 kg), SpO2 98 %.  Filed Weights   03/26/16 2222 03/27/16 0510 03/28/16 0544  Weight: 220 lb 8 oz (100 kg) 217 lb 6.4 oz (98.6 kg) 218 lb 3.2 oz (99 kg)    Labs & Radiologic Studies     CBC  Recent Labs  03/26/16 2329 03/27/16 0333 03/28/16 0319  WBC 9.6 8.4 7.1  NEUTROABS 6.4  --   --   HGB 16.6 15.8 15.4  HCT 47.7 46.0 44.6  MCV 89.0 88.6 88.7  PLT 138* 139* 135*   Basic Metabolic Panel  Recent Labs  03/27/16 0338  NA 140  K 4.1  CL 106  CO2  25  GLUCOSE 117*  BUN 24*  CREATININE 1.53*  CALCIUM 9.3  MG 2.0   Liver Function Tests  Recent Labs  03/27/16 0338  AST 40  ALT 44  ALKPHOS 55  BILITOT 1.3*  PROT 6.5  ALBUMIN 3.3*   No results for input(s): LIPASE, AMYLASE in the last 72 hours. Cardiac Enzymes  Recent Labs  03/26/16 2329 03/27/16 0333 03/27/16 1139  TROPONINI 0.50* 0.41* 0.22*   BNP Invalid input(s): POCBNP D-Dimer No results for input(s): DDIMER in the last 72 hours. Hemoglobin A1C No results for input(s): HGBA1C in the last 72  hours. Fasting Lipid Panel No results for input(s): CHOL, HDL, LDLCALC, TRIG, CHOLHDL, LDLDIRECT in the last 72 hours. Thyroid Function Tests No results for input(s): TSH, T4TOTAL, T3FREE, THYROIDAB in the last 72 hours.  Invalid input(s): FREET3  Nm Myocar Multi W/spect W/wall Motion / Ef  Result Date: 03/28/2016 CLINICAL DATA:  Chest pain EXAM: MYOCARDIAL IMAGING WITH SPECT (REST AND PHARMACOLOGIC-STRESS) GATED LEFT VENTRICULAR WALL MOTION STUDY LEFT VENTRICULAR EJECTION FRACTION TECHNIQUE: Standard myocardial SPECT imaging was performed after resting intravenous injection of 10 mCi Tc-5018m tetrofosmin. Subsequently, intravenous infusion of Lexiscan was performed under the supervision of the Cardiology staff. At peak effect of the drug, 30 mCi Tc-8718m tetrofosmin was injected intravenously and standard myocardial SPECT imaging was performed. Quantitative gated imaging was also performed to evaluate left ventricular wall motion, and estimate left ventricular ejection fraction. COMPARISON:  10/11/2014 FINDINGS: Perfusion: No decreased activity in the left ventricle on stress imaging to suggest reversible ischemia or infarction. Wall Motion: Mild inferior and septal wall hypokinesis otherwise normal wall motion. No LV chamber dilatation. Left Ventricular Ejection Fraction: 44 % End diastolic volume 72 ml End systolic volume 40 ml IMPRESSION: 1. No reversible ischemia or infarction. 2. Mild inferior and septal hypokinesis. 3. Left ventricular ejection fraction 44% 4. Non invasive risk stratification*: Low *2012 Appropriate Use Criteria for Coronary Revascularization Focused Update: J Am Coll Cardiol. 2012;59(9):857-881. http://content.dementiazones.comonlinejacc.org/article.aspx?articleid=1201161 Electronically Signed   By: Judie PetitM.  Shick M.D.   On: 03/28/2016 13:17    Diagnostic Studies/Procedures    Echocardiogram: 03/28/2016 Study Conclusions  - Left ventricle: The cavity size was normal. Wall thickness was   increased  in a pattern of mild LVH. Systolic function was normal.   The estimated ejection fraction was in the range of 50% to 55%.   Wall motion was normal; there were no regional wall motion   abnormalities. - Aortic valve: Valve area (VTI): 1.73 cm^2. Valve area (Vmax): 2.1   cm^2. - Right ventricle: The cavity size was mildly dilated. - Atrial septum: No defect or patent foramen ovale was identified. - Pulmonary arteries: Systolic pressure was moderately increased.   PA peak pressure: 42 mm Hg (S). - Technically difficult study. Echo contrast was used to enhance   visualization.  Disposition   Pt is being discharged home today in good condition.  Follow-up Plans & Appointments    Follow-up Information    Lewayne BuntingGregg Taylor, MD Follow up.   Specialty:  Cardiology Why:  The office will contact you to arrange Hospital follow-up. If you do not hear from them in 2-3 business days, please contact the number provided.  Contact information: [address] [place] [address] [phone]          Discharge Instructions    Diet - low sodium heart healthy    Complete by:  As directed       Discharge Medications  Medication List    TAKE these medications   aspirin 81 MG tablet Take 81 mg by mouth daily.   atenolol 100 MG tablet Commonly known as:  TENORMIN Take 0.5 tablets (50 mg total) by mouth daily. What changed:  how much to take   buPROPion 75 MG tablet Commonly known as:  WELLBUTRIN Take 37.5 mg by mouth 2 (two) times daily.   escitalopram 5 MG tablet Commonly known as:  LEXAPRO Take 5 mg by mouth daily.   losartan 50 MG tablet Commonly known as:  COZAAR Take 1 tablet by mouth daily.   ranitidine 150 MG tablet Commonly known as:  ZANTAC Take 150 mg by mouth 2 (two) times daily.       Aspirin prescribed at discharge?  Yes High Intensity Statin Prescribed? (Lipitor 40-80mg  or Crestor 20-40mg ): No: Statin Intolerant Beta Blocker Prescribed?  Yes For EF 40% or less, Was ACEI/ARB Prescribed? No: N/A; EF 50-55% ADP Receptor Inhibitor Prescribed? (i.e. Plavix etc.-Includes Medically Managed Patients): No For EF <40%, Aldosterone Inhibitor Prescribed? No: N/A; EF 50-55% Was EF assessed during THIS hospitalization? Yes Was Cardiac Rehab II ordered? (Included Medically managed Patients): No   Allergies Allergies  Allergen Reactions  . Lovastatin Other (See Comments)    myalgias  . Lisinopril Itching     Outstanding Labs/Studies   None Pending  Duration of Discharge Encounter   Greater than 30 minutes including physician time.  Signed, Ellsworth Lennox, PA-C 03/28/2016, 3:57 PM

## 2016-04-09 ENCOUNTER — Encounter: Payer: Self-pay | Admitting: Physician Assistant
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
## 2016-04-12 ENCOUNTER — Ambulatory Visit: Payer: Medicare Other | Admitting: Physician Assistant

## 2016-04-12 IMAGING — CR DG CHEST 1V PORT
1 series · 1 of 1 positions shown · non-contrast
Comparison: Portable chest x-ray October 16, 2014

CLINICAL DATA: Follow-up of pneumothorax, closure patent foramen
ovale, pacemaker removal.

EXAM:
PORTABLE CHEST - 1 VIEW

[ap portable]
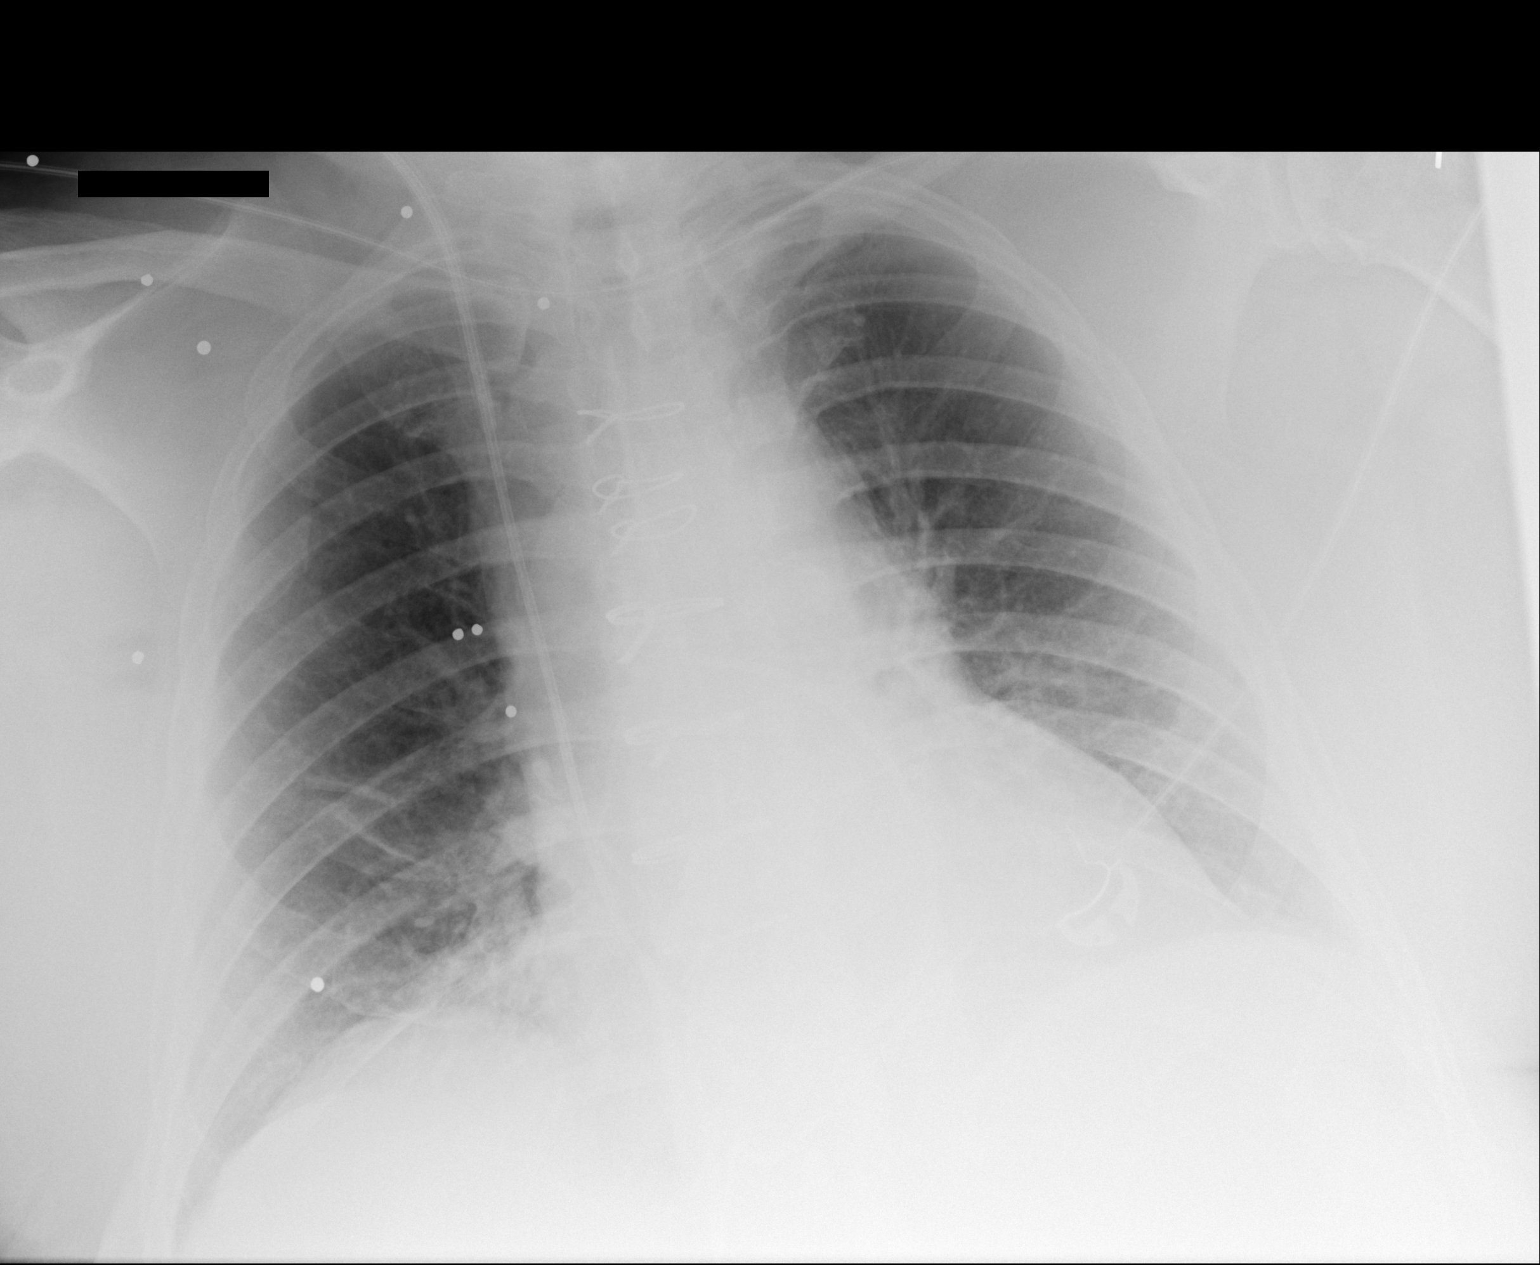

[1 of 1 positions shown; findings below may reference images not displayed]

FINDINGS: There has been interval extubation of the trachea and of the
esophagus. A mediastinal drain and left lower chest tube can be
faintly discerned. The Swan-Ganz catheter tip projects in the
proximal right main pulmonary artery. The cardiac silhouette is
mildly enlarged. The pulmonary vascularity is not engorged. There is
subsegmental atelectasis at the right lung base. There is no pleural
effusion or pneumothorax
IMPRESSION: There is no pneumothorax. There is persistent subsegmental
atelectasis at the right lung base. There is stable mild enlargement
of the cardiac silhouette without pulmonary edema.

## 2016-04-12 NOTE — Progress Notes (Deleted)
Cardiology Office Note    Date:  04/12/2016   ID:  Kenneth [name], DOB 03-05-1941, MRN [id number]  PCP:  Jerene Pitch, MD  Cardiologist: Dr. [name]  No chief complaint on file.   History of Present Illness:  Kenneth [name] is a 75 y.o. male with previous AV nodal disease (s/p pacemaker and subsequent explant in 2016 for infection (removal of the lead and closure of PFO on cardiopulmonary bypass-- s/p sternotomy). Has hypertension, HLD, chronic diastolic CHF and stage III CK D.  Patient had recent admission to the hospital with NSTEMI. Troponins were flat at 0.41 and 0.22. Also had syncope felt secondary to orthostatic hypotension. No significant positives or arrhythmias were noted on telemetry. He did have some bradycardia in the mid 50s. As a atenolol was decreased to 50 mg daily. Because of CK D Lexi scan Myoview was performed and showed no evidence of reversible ischemia or infarction. LVEF 44% and the stay was overall low risk. An echocardiogram showed LVEF to be 50-55% with no regional wall motion abnormality. PA pressure was moderately increased at 42 mmHg.    Past Medical History:  Diagnosis Date  . AV block    a. s/p MDT dual chamber pacemaker  . BPH (benign prostatic hyperplasia)   . CAD (coronary artery disease)    a. details unclear  . Chronic anxiety   . COPD (chronic obstructive pulmonary disease) (HCC)   . GERD (gastroesophageal reflux disease)   . HTN (hypertension)   . Hyperlipemia   . Obesity   . OSA (obstructive sleep apnea)    a. on CPAP  . Osteoarthritis   . Overweight(278.02)   . Renal impairment   . Type 2 diabetes mellitus (HCC)     Past Surgical History:  Procedure Laterality Date  . CANNULATION FOR CARDIOPULMONARY BYPASS N/A 10/16/2014   Procedure: CANNULATION FOR CARDIOPULMONARY BYPASS;  Surgeon: Delight Ovens, MD;  Location: [facility] OR;  Service: Open Heart Surgery;  Laterality: N/A;  Left femoral Line insertion with ultrasound  . left eye  removal  1957  . PACEMAKER INSERTION  2003   [employer]  . PACEMAKER LEAD REMOVAL N/A 10/16/2014   Procedure: PACEMAKER LEAD REMOVAL;  Surgeon: Delight Ovens, MD;  Location: [facility] OR;  Service: Thoracic;  Laterality: N/A;  Extraction defected paicing lead.  Removal endocardio  vegetation  . PACEMAKER LEAD REMOVAL Right 10/16/2014   Procedure: PACEMAKER LEAD EXTRACTION;  Surgeon: Marinus Maw, MD;  Location: [facility] OR;  Service: Cardiovascular;  Laterality: Right;  . PERMANENT PACEMAKER GENERATOR CHANGE N/A 06/23/2011   Procedure: PERMANENT PACEMAKER GENERATOR CHANGE;  Surgeon: Duke Salvia, MD;  Location: [facility] CATH LAB;  Service: Cardiovascular;  Laterality: N/A;  . REPAIR OF PATENT FORAMEN OVALE N/A 10/16/2014   Procedure: CLOSURE OF PATENT FORAMEN OVALE;  Surgeon: Delight Ovens, MD;  Location: [facility] OR;  Service: Open Heart Surgery;  Laterality: N/A;  . TEE WITHOUT CARDIOVERSION N/A 10/03/2014   Procedure: TRANSESOPHAGEAL ECHOCARDIOGRAM (TEE);  Surgeon: Wendall Stade, MD;  Location: [facility] ENDOSCOPY;  Service: Cardiovascular;  Laterality: N/A;    Current Medications: Outpatient Medications Prior to Visit  Medication Sig Dispense Refill  . aspirin 81 MG tablet Take 81 mg by mouth daily.    Marland Kitchen atenolol (TENORMIN) 100 MG tablet Take 0.5 tablets (50 mg total) by mouth daily.    Marland Kitchen buPROPion (WELLBUTRIN) 75 MG tablet Take 37.5 mg by mouth 2 (two) times daily.    Marland Kitchen escitalopram (LEXAPRO) 5 MG tablet Take  5 mg by mouth daily.    Marland Kitchen. losartan (COZAAR) 50 MG tablet Take 1 tablet by mouth daily.    . ranitidine (ZANTAC) 150 MG tablet Take 150 mg by mouth 2 (two) times daily.     No facility-administered medications prior to visit.      Allergies:   Lovastatin and Lisinopril   Social History   Social History  . Marital status: Married    Spouse name: Spouse Deceased  . Number of children: 1  . Years of education: N/A   Occupational History  . retired    Social History Main Topics  . Smoking  status: Former Smoker    Types: Cigarettes    Quit date: 05/25/1991  . Smokeless tobacco: Never Used  . Alcohol use No  . Drug use: No  . Sexual activity: Not Currently   Other Topics Concern  . Not on file   Social History Narrative   Patient reports feeling sad after loosing his wife in January. States he is less sad now. Lives at home alone with his cat midnight. His son checks on him often. Reports being an active member of his local [employer].     Family History:  The patient's ***family history includes Heart disease in his mother.   ROS:   Please see the history of present illness.    ROS All other systems reviewed and are negative.   PHYSICAL EXAM:   VS:  There were no vitals taken for this visit.  Physical Exam  GEN: Well nourished, well developed, in no acute distress  HEENT: normal  Neck: no JVD, carotid bruits, or masses Cardiac:RRR; no murmurs, rubs, or gallops  Respiratory:  clear to auscultation bilaterally, normal work of breathing GI: soft, nontender, nondistended, + BS Ext: without cyanosis, clubbing, or edema, Good distal pulses bilaterally MS: no deformity or atrophy  Skin: warm and dry, no rash Neuro:  Alert and Oriented x 3, Strength and sensation are intact Psych: euthymic mood, full affect  Wt Readings from Last 3 Encounters:  03/28/16 218 lb 3.2 oz (99 kg)  02/11/15 251 lb 3.2 oz (113.9 kg)  11/27/14 244 lb (110.7 kg)      Studies/Labs Reviewed:   EKG:  EKG is*** ordered today.  The ekg ordered today demonstrates ***  Recent Labs: 03/26/2016: B Natriuretic Peptide 194.6 03/27/2016: ALT 44; BUN 24; Creatinine, Ser 1.53; Magnesium 2.0; Potassium 4.1; Sodium 140 03/28/2016: Hemoglobin 15.4; Platelets 135   Lipid Panel No results found for: CHOL, TRIG, HDL, CHOLHDL, VLDL, LDLCALC, LDLDIRECT  Additional studies/ records that were reviewed today include:  Study Conclusions   - Left ventricle: The cavity size was normal. Wall thickness  was   increased in a pattern of mild LVH. Systolic function was normal.   The estimated ejection fraction was in the range of 50% to 55%.   Wall motion was normal; there were no regional wall motion   abnormalities. - Aortic valve: Valve area (VTI): 1.73 cm^2. Valve area (Vmax): 2.1   cm^2. - Right ventricle: The cavity size was mildly dilated. - Atrial septum: No defect or patent foramen ovale was identified. - Pulmonary arteries: Systolic pressure was moderately increased.   PA peak pressure: 42 mm Hg (S). - Technically difficult study. Echo contrast was used to enhance   visualization.     ASSESSMENT:    No diagnosis found.   PLAN:  In order of problems listed above:      Medication Adjustments/Labs and Tests  Ordered: Current medicines are reviewed at length with the patient today.  Concerns regarding medicines are outlined above.  Medication changes, Labs and Tests ordered today are listed in the Patient Instructions below. There are no Patient Instructions on file for this visit.   Elson ClanSigned, Michele Lenze, PA-C  04/12/2016 12:55 PM    [facility] [address], [place], [place]  [address] Phone: [phone]; Fax: [phone]

## 2016-04-13 ENCOUNTER — Encounter: Payer: Self-pay | Admitting: Physician Assistant
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
## 2023-09-22 DEATH — deceased
